# Patient Record
Sex: Female | Born: 1950 | Race: White | Hispanic: No | Smoking: Former smoker
Health system: Southern US, Community
[De-identification: ages and names within clinical notes are randomized; demographics above are authoritative.]

## PROBLEM LIST (undated history)

## (undated) DIAGNOSIS — I10 Essential (primary) hypertension: Secondary | ICD-10-CM

## (undated) DIAGNOSIS — E785 Hyperlipidemia, unspecified: Secondary | ICD-10-CM

## (undated) DIAGNOSIS — N879 Dysplasia of cervix uteri, unspecified: Secondary | ICD-10-CM

## (undated) DIAGNOSIS — M1990 Unspecified osteoarthritis, unspecified site: Secondary | ICD-10-CM

## (undated) DIAGNOSIS — M4316 Spondylolisthesis, lumbar region: Secondary | ICD-10-CM

## (undated) DIAGNOSIS — R351 Nocturia: Secondary | ICD-10-CM

## (undated) DIAGNOSIS — Z974 Presence of external hearing-aid: Secondary | ICD-10-CM

## (undated) DIAGNOSIS — R350 Frequency of micturition: Secondary | ICD-10-CM

## (undated) DIAGNOSIS — H269 Unspecified cataract: Secondary | ICD-10-CM

## (undated) HISTORY — PX: FOOT GANGLION EXCISION: SHX1660

## (undated) HISTORY — PX: OTHER SURGICAL HISTORY: SHX169

## (undated) HISTORY — PX: ABDOMINAL HYSTERECTOMY: SHX81

## (undated) HISTORY — PX: EYE SURGERY: SHX253

## (undated) HISTORY — PX: COLONOSCOPY: SHX174

---

## 1981-04-23 HISTORY — PX: OTHER SURGICAL HISTORY: SHX169

## 1983-04-24 HISTORY — PX: OTHER SURGICAL HISTORY: SHX169

## 1998-04-23 HISTORY — PX: HYSTERECTOMY ABDOMINAL WITH SALPINGECTOMY: SHX6725

## 2003-04-24 HISTORY — PX: OTHER SURGICAL HISTORY: SHX169

## 2005-02-06 ENCOUNTER — Ambulatory Visit: Payer: Self-pay | Admitting: Unknown Physician Specialty

## 2006-02-12 ENCOUNTER — Ambulatory Visit: Payer: Self-pay | Admitting: Unknown Physician Specialty

## 2007-03-04 ENCOUNTER — Ambulatory Visit: Payer: Self-pay | Admitting: Unknown Physician Specialty

## 2008-03-04 ENCOUNTER — Ambulatory Visit: Payer: Self-pay | Admitting: Unknown Physician Specialty

## 2008-03-10 ENCOUNTER — Ambulatory Visit: Payer: Self-pay | Admitting: Unknown Physician Specialty

## 2008-07-14 ENCOUNTER — Ambulatory Visit: Payer: Self-pay | Admitting: Internal Medicine

## 2008-08-09 ENCOUNTER — Encounter: Payer: Self-pay | Admitting: Neurosurgery

## 2008-08-21 ENCOUNTER — Encounter: Payer: Self-pay | Admitting: Neurosurgery

## 2009-03-08 ENCOUNTER — Ambulatory Visit: Payer: Self-pay | Admitting: Unknown Physician Specialty

## 2010-02-25 ENCOUNTER — Emergency Department: Payer: Self-pay | Admitting: Unknown Physician Specialty

## 2010-03-09 ENCOUNTER — Ambulatory Visit: Payer: Self-pay | Admitting: Unknown Physician Specialty

## 2010-04-23 HISTORY — PX: CATARACT EXTRACTION: SUR2

## 2010-12-04 ENCOUNTER — Ambulatory Visit: Payer: Self-pay | Admitting: Ophthalmology

## 2011-11-20 ENCOUNTER — Ambulatory Visit: Payer: Self-pay | Admitting: Obstetrics and Gynecology

## 2011-12-04 ENCOUNTER — Ambulatory Visit: Payer: Self-pay | Admitting: Obstetrics and Gynecology

## 2013-07-03 DIAGNOSIS — E785 Hyperlipidemia, unspecified: Secondary | ICD-10-CM | POA: Insufficient documentation

## 2013-07-03 DIAGNOSIS — Z79890 Hormone replacement therapy (postmenopausal): Secondary | ICD-10-CM | POA: Insufficient documentation

## 2013-10-15 ENCOUNTER — Ambulatory Visit: Payer: Self-pay | Admitting: Obstetrics and Gynecology

## 2013-10-19 ENCOUNTER — Ambulatory Visit: Payer: Self-pay | Admitting: Obstetrics and Gynecology

## 2014-04-21 ENCOUNTER — Ambulatory Visit: Payer: Self-pay | Admitting: Obstetrics and Gynecology

## 2014-09-28 ENCOUNTER — Other Ambulatory Visit: Payer: Self-pay | Admitting: Obstetrics and Gynecology

## 2014-09-28 DIAGNOSIS — N63 Unspecified lump in unspecified breast: Secondary | ICD-10-CM

## 2014-10-27 ENCOUNTER — Ambulatory Visit
Admission: RE | Admit: 2014-10-27 | Discharge: 2014-10-27 | Disposition: A | Discharge status: Home / Self Care | Payer: BC Managed Care – PPO | Source: Ambulatory Visit | Attending: Obstetrics and Gynecology | Admitting: Obstetrics and Gynecology

## 2014-10-27 ENCOUNTER — Ambulatory Visit: Payer: BC Managed Care – PPO

## 2014-10-27 DIAGNOSIS — N63 Unspecified lump in unspecified breast: Secondary | ICD-10-CM

## 2014-10-27 DIAGNOSIS — N6489 Other specified disorders of breast: Secondary | ICD-10-CM | POA: Insufficient documentation

## 2014-10-27 DIAGNOSIS — R921 Mammographic calcification found on diagnostic imaging of breast: Secondary | ICD-10-CM | POA: Diagnosis not present

## 2015-03-01 DIAGNOSIS — G8929 Other chronic pain: Secondary | ICD-10-CM | POA: Insufficient documentation

## 2015-03-01 DIAGNOSIS — M545 Low back pain, unspecified: Secondary | ICD-10-CM | POA: Insufficient documentation

## 2015-03-01 DIAGNOSIS — M48061 Spinal stenosis, lumbar region without neurogenic claudication: Secondary | ICD-10-CM | POA: Insufficient documentation

## 2015-03-04 ENCOUNTER — Other Ambulatory Visit: Payer: Self-pay | Admitting: Neurosurgery

## 2015-03-04 DIAGNOSIS — M4316 Spondylolisthesis, lumbar region: Secondary | ICD-10-CM

## 2015-03-09 ENCOUNTER — Ambulatory Visit: Payer: BC Managed Care – PPO

## 2015-03-24 ENCOUNTER — Ambulatory Visit
Admission: RE | Admit: 2015-03-24 | Discharge: 2015-03-24 | Disposition: A | Discharge status: Home / Self Care | Payer: BC Managed Care – PPO | Source: Ambulatory Visit | Attending: Neurosurgery | Admitting: Neurosurgery

## 2015-03-24 DIAGNOSIS — M4806 Spinal stenosis, lumbar region: Secondary | ICD-10-CM | POA: Insufficient documentation

## 2015-03-24 DIAGNOSIS — M4316 Spondylolisthesis, lumbar region: Secondary | ICD-10-CM | POA: Diagnosis present

## 2015-04-24 HISTORY — PX: LUMBAR FUSION: SHX111

## 2015-04-28 ENCOUNTER — Other Ambulatory Visit: Payer: Self-pay | Admitting: Obstetrics and Gynecology

## 2015-04-28 DIAGNOSIS — N6489 Other specified disorders of breast: Secondary | ICD-10-CM

## 2015-05-11 ENCOUNTER — Other Ambulatory Visit: Payer: BC Managed Care – PPO

## 2015-05-11 ENCOUNTER — Ambulatory Visit: Payer: BC Managed Care – PPO

## 2015-07-19 ENCOUNTER — Other Ambulatory Visit: Payer: Self-pay | Admitting: Neurosurgery

## 2015-10-03 ENCOUNTER — Other Ambulatory Visit (HOSPITAL_COMMUNITY): Payer: BC Managed Care – PPO

## 2015-10-06 ENCOUNTER — Ambulatory Visit: Payer: BC Managed Care – PPO | Attending: Neurosurgery | Admitting: Physical Therapy

## 2015-10-06 ENCOUNTER — Encounter: Payer: Self-pay | Admitting: Physical Therapy

## 2015-10-06 DIAGNOSIS — R262 Difficulty in walking, not elsewhere classified: Secondary | ICD-10-CM

## 2015-10-06 DIAGNOSIS — M5442 Lumbago with sciatica, left side: Secondary | ICD-10-CM | POA: Diagnosis present

## 2015-10-06 DIAGNOSIS — M6281 Muscle weakness (generalized): Secondary | ICD-10-CM | POA: Insufficient documentation

## 2015-10-06 DIAGNOSIS — M5441 Lumbago with sciatica, right side: Secondary | ICD-10-CM | POA: Insufficient documentation

## 2015-10-06 NOTE — Therapy (Signed)
[place] [facility] [address], [place], [address] Phone: [phone]   Fax:  [phone]  Physical Therapy Evaluation  Patient Details  Name: Angela Walters MRN: [id number] Date of Birth: 1950/12/15 Referring Provider: Peggye Ley, MD  Encounter Date: 10/06/2015      PT End of Session - 10/06/15 2015    Visit Number 1   Number of Visits 12   Date for PT Re-Evaluation 11/17/15   PT Start Time 1615   PT Stop Time 1715   PT Time Calculation (min) 60 min   Activity Tolerance Patient tolerated treatment well;No increased pain   Behavior During Therapy [facility] for tasks assessed/performed      History reviewed. No pertinent past medical history.  Past Surgical History  Procedure Laterality Date  . Excision / biopsy breast / nipple / duct Left 1985    duct removal    There were no vitals filed for this visit.       Subjective Assessment - 10/06/15 1639    Subjective Patient reports long history of back and LE pain that has gotten progressively worse over the past 20 year. She had a recent exaccerbation of low back pain on 08/14/15 from lifting a window at work when she felt increased pain radiaiting down bilateral LE the following day. Patient reports increased symptoms with prolonged lying supine, increased pain/fatigue with prolonged walking (~.7 miles) (note; patient reports prior to epidural she could only walk 1 block before pain stopped her and she was able to walk 2 miles without a problem in the past which demonstrates a decrease in function), bending forward in sitting. States increased discomfort and numbness in the anterior aspect of upper thighs .Alleviation of symptoms with taking medication, sitting and sidelying into the fetal position. Reports she is worse in the morning when not moving. States experiencing increased bladder issues since onset of pain; with increased difficulty voiding but denies  incontinence. Patient reports leg cramps wake her at night. States the worst pain she has experienced in the past week was a 10/10 and the best a 0/10 pain. Patient states previous therapy for low back pain had minimal lasting results on decreasing pain and patient reports she is now coming to therapy to satisfy her insurance company's request prior to anticipated surgery.    Pertinent History Expereicnes back pain for 20 years; Epidural on 09/06/15 with reported alleviation of severe pain in back. She was anticipating surgery 10/10/2015 until she was informed she needed to attend physical therapy prior to insurance approval .    Limitations Lifting;Walking;Sitting   How long can you walk comfortably? ~.48miles before increased fatigue and pain   Diagnostic tests Impressions from MRI report: 1. Progressive multifactorial spinal stenosis at L3-4, now severe. There is advanced facet disease with a resulting grade 1 anterolisthesis and dissue disc bulging. 2. Mild multifactorial spinal stenosis at L4-5 with asymmetric left-sdied facet hypertrophy 3. Mild multifactorial spinal stenosis at L2-3. 4. Similar overall alignment. No acute osseous findings.    Patient Stated Goals To decrease pain and improve muscular endurance   Currently in Pain? No/denies   Pain Location Back   Pain Orientation Right;Left   Pain Descriptors / Indicators Aching;Numbness;Spasm   Pain Type Acute pain;Chronic pain   Pain Radiating Towards Anterior aspect of thigh   Pain Onset More than a month ago   Pain Frequency Intermittent   Aggravating Factors  Bending forward, prolonged positioning  [facility] - 10/06/15 1632    Assessment   Medical Diagnosis M54.16 Radiculopathy, lumbar region    Referring Provider Peggye LeyJeffery Jenkins, MD   Onset Date/Surgical Date 08/14/15   Hand Dominance Right   Next MD Visit unknown   Prior Therapy None   Precautions   Precautions None   Restrictions   Weight Bearing  Restrictions No   Balance Screen   Has the patient fallen in the past 6 months No   Home Environment   Living Environment Private residence   Living Arrangements Spouse/significant other   Available Help at Discharge Family   Type of Home House   Home Access Stairs to enter   Entrance Stairs-Number of Steps 7   Entrance Stairs-Rails Can reach both   Home Layout Two level   Alternate Level Stairs-Number of Steps 13   Alternate Level Stairs-Rails Can reach both   Prior Function   Level of Independence Independent   Vocation Full time employment   Vocation Requirements Walking, sitting, reaching up    Leisure reading, walking, playing with grandson      Objective: Observation: Constantly changes positions in sitting for comfort Palpation: Increased guarding and spasms along glute max, hamstring, lumbar and thoracic paraspinals bilaterally.   Measurement:  Lumbar AROM/MMT: Flexion: WNL, Extension: WNL-- slight increase in pain at end range, Left lateral flexion: WNL, Right lateral flexion: WNL, Right rotation: WNL, Left rotation: WNL Nerve Exam: Sensation: L1:WNL, L2:WNL, L3:WNL, L4:WNL, L5:WNL, S1:WNL, S2:WNL; MMT: L1/L2:5/5 bilaterally, L3:5/5 bilaterally, L4:5/5 bilaterally, L5:5/5 bilaterally, S1:5/5 bilaterally, S2:5/5 bilaterally; Reflexes: L4:2+ bilaterally, S1:2+ bilaterally Hip AROM/MMT: Flexion:WNL;5/5, Extension:WNL;5/5 Special Testing: Cross straight leg raise, FADIR, FABER, Active SLR, sacral compression: negative  Hamstring length: >90 deg equal on both sides TrA activation in hooklying: good Multifidus activation in prone: good (increased hamstring activation with hip ext)  Outcome Measures:  MODI: 14% MODI before epidural on 09/06/15: 58%  Treatment: Therapeutic Exercise: Patient performed exercises with guidance, verbal and tactile cues and demonstration of therapist: Seated clamshells -- x 15 Seated glute squeezes/ ball squeezes -- x 15 Prone hip extension  with ball under hip -- x 15  Manual Therapy:  Soft tissue mobilization utilizing superficial techniques over the gluteals and thoracic extensors to decrease pain and spasms.   Patient response to treatment: No increase in symptoms during or after treatment session today. Good demonstration in seated clamshells requiring minimal cueing on proper hip position to decrease pain and spasms.         PT Education - 10/06/15 2014    Education provided Yes   Education Details HEP: clamshells in sitting, ball squeeze/glute squeeze in sitting. Educated on proper positioning when rising from lying   Person(s) Educated Patient   Methods Explanation;Demonstration   Comprehension Verbalized understanding;Returned demonstration             PT Long Term Goals - 10/06/15 2021    PT LONG TERM GOAL #1   Title Patient will demonstrate a MODI of <6% 11/17/15 to demonstrate significant improvement in lumbar function and ability to bend without increase in pain.   Baseline MODI: 14%   Status New   PT LONG TERM GOAL #2   Title Patient will be able to walk >591mile without onset of fatigue/pain by 11/17/15 to demonstrate significant improvement in low back function and increased functional capacity   Baseline Increased fatigue and pain after .7miles of ambulation   Status New   PT LONG TERM GOAL #3   Title Patient  will be independent with exercise performance and progression focused on improving lumbar stabilization by 11/17/15 to continue benefit from therapy after discharge.    Baseline Dependent with exercise performance and progression.   Status New               Plan - 10/06/15 2017    Clinical Impression Statement Patient is a 65 yo female experiencing increased low back pain with radiating symptoms down bilateral LEs.Patient reports long history of back and LE pain that has gotten progressively worse over the past 20 year. She had a recent exaccerbation of low back pain on 08/14/15 from  lifting a window at work when she felt increased pain radiaiting down bilateral LE the following day. Patient reports increased symptoms with prolonged lying supine, increased pain/fatigue with prolonged walking (~.7 miles) (note; patient reports prior to epidural she could only walk 1 block before pain stopped her and she was able to walk 2 miles without a problem in the past which demonstrates a decrease in function), bending forward in sitting. States increased discomfort and numbness in the anterior aspect of upper thighs .Alleviation of symptoms with taking medication, sitting and sidelying into the fetal position. Reports she is worse in the morning when not moving. States experiencing increased bladder issues since onset of pain; with increased difficulty voiding but denies incontinence. Patient reports leg cramps wake her at night. States the worst pain she has experienced in the past week was a 10/10 and the best a 0/10 pain. Patient states previous therapy for low back pain had minimal lasting results on decreasing pain and patient reports she is now coming to therapy to satisfy her insurance company's request prior to anticipated surgery.  Patient demonstrates a current MODI score of 14% dysfunction, however pain is masked by recent epidural and patient reports increased MODI score of 58% before receiving the epidural. Patient also demonstrates decreased motor control with decreased activation of multifidus/TrA activation and decreased muscular endurance with functional activities such as ambulation. Patient will benefit from trial of physical therapy to prepare for anticipated surgery.     Rehab Potential Fair   Clinical Impairments Affecting Rehab Potential (+) Family support (-) age, MRI findings signigicant for severe spinal stenosis; chronic condition   PT Frequency 2x / week   PT Duration 6 weeks   PT Treatment/Interventions Therapeutic exercise;Moist Heat;Manual techniques;Therapeutic  activities;Iontophoresis 4mg /ml Dexamethasone;Electrical Stimulation;Balance training;Ultrasound;Passive range of motion;Patient/family education   PT Next Visit Plan Advance lumbar stabilization exercises   PT Home Exercise Plan Ball squeeze/glute squeeze; seated clamshells in sitting      Patient will benefit from skilled therapeutic intervention in order to improve the following deficits and impairments:  Difficulty walking, Increased fascial restricitons, Decreased endurance, Increased muscle spasms, Decreased activity tolerance, Hypermobility, Pain, Hypomobility, Decreased strength, Increased edema, Impaired sensation  Visit Diagnosis: Bilateral low back pain with sciatica, sciatica laterality unspecified - Plan: PT plan of care cert/re-cert  Difficulty in walking, not elsewhere classified - Plan: PT plan of care cert/re-cert  Muscle weakness (generalized) - Plan: PT plan of care cert/re-cert     Problem List There are no active problems to display for this patient.   Myrene Galas, SPT 10/07/2015, 1:31 PM  [place] [facility] PH[facility] SPORTS MEDICINE [address] [place], [address] Phone: [phone]   Fax:  [phone]  Name: Angela Walters MRN: [id number] Date of Birth: [birth year]

## 2015-10-10 ENCOUNTER — Inpatient Hospital Stay (HOSPITAL_COMMUNITY): Admission: RE | Admit: 2015-10-10 | Payer: BC Managed Care – PPO | Source: Ambulatory Visit | Admitting: Neurosurgery

## 2015-10-10 ENCOUNTER — Encounter (HOSPITAL_COMMUNITY): Admission: RE | Payer: Self-pay | Source: Ambulatory Visit

## 2015-10-10 SURGERY — POSTERIOR LUMBAR FUSION 2 LEVEL
Anesthesia: General

## 2015-10-11 ENCOUNTER — Encounter: Payer: Self-pay | Admitting: Physical Therapy

## 2015-10-11 ENCOUNTER — Ambulatory Visit: Payer: BC Managed Care – PPO | Admitting: Physical Therapy

## 2015-10-11 DIAGNOSIS — M5441 Lumbago with sciatica, right side: Secondary | ICD-10-CM | POA: Diagnosis not present

## 2015-10-11 DIAGNOSIS — M6281 Muscle weakness (generalized): Secondary | ICD-10-CM

## 2015-10-11 DIAGNOSIS — R262 Difficulty in walking, not elsewhere classified: Secondary | ICD-10-CM

## 2015-10-11 DIAGNOSIS — M5442 Lumbago with sciatica, left side: Principal | ICD-10-CM

## 2015-10-11 NOTE — Therapy (Signed)
[place] [facility] [address] [place], [place], [address] Phone: [phone]   Fax:  [phone]  Physical Therapy Treatment  Patient Details  Name: Angela Walters MRN: [id number] Date of Birth: 03-16-1951 Referring Provider: Peggye LeyJeffery Jenkins, MD  Encounter Date: 10/11/2015      PT End of Session - 10/11/15 1024    Visit Number 2   Number of Visits 12   Date for PT Re-Evaluation 11/17/15   PT Start Time 0800   PT Stop Time 0844   PT Time Calculation (min) 44 min   Activity Tolerance Patient tolerated treatment well;No increased pain   Behavior During Therapy [facility] for tasks assessed/performed      History reviewed. No pertinent past medical history.  Past Surgical History  Procedure Laterality Date  . Excision / biopsy breast / nipple / duct Left 1985    duct removal    There were no vitals filed for this visit.      Subjective Assessment - 10/11/15 0812    Subjective Patient states she's been performing HEP and went to water aerobics class without any increase in pain. Reports she felt increased fatigue after performing water aerobics. Reports she has increased aggravated pain when performing lumbar extension.    Pertinent History Expereicnes back pain for 20 years; Epidural on 09/06/15 with reported alleviation of severe pain in back. She was anticipating surgery 10/10/2015 until she was informed she needed to attend physical therapy prior to insurance approval .    Limitations Lifting;Walking;Sitting   How long can you walk comfortably? ~.777miles before increased fatigue and pain   Diagnostic tests Impressions from MRI report: 1. Progressive multifactorial spinal stenosis at L3-4, now severe. There is advanced facet disease with a resulting grade 1 anterolisthesis and dissue disc bulging. 2. Mild multifactorial spinal stenosis at L4-5 with asymmetric left-sdied facet hypertrophy 3. Mild multifactorial spinal stenosis  at L2-3. 4. Similar overall alignment. No acute osseous findings.    Patient Stated Goals To decrease pain and improve muscular endurance   Currently in Pain? No/denies   Pain Onset More than a month ago      Objective: Observation: Constantly changes positions in sitting for comfort Palpation: Increased guarding and spasms along glute max, hamstring, lumbar and thoracic paraspinals bilaterally.    Treatment: Therapeutic Exercise: Patient performed exercises with guidance, verbal and tactile cues and demonstration of therapist: Seated clamshells -- x 15 Seated glute squeezes/ ball squeezes -- x 15 Seated ball roll outs in sitting -- 2 min LTRs in hookyling with legs underneath physioball -- 2min  Hooklying ball squeeze glute squeeze -- x20 Single leg fall outs in hookyling with TrA contraction -- x20 Sidelying clamshells -- x25 Sidelying reverse clamshells --x30 Sidelying hip abduction -- x10 :significant increase in fatigue   Patient response to treatment: No increase in symptoms during or after treatment session today. Good demonstration of sidelying hip abductionrequiring minimal cueing on hip position to perform with proper muscular activation.         PT Education - 10/11/15 1022    Education provided Yes   Education Details HEP: sidelying clamshells, reverse clam shells, sidelying hip abduction   Person(s) Educated Patient   Methods Demonstration;Explanation   Comprehension Verbalized understanding;Returned demonstration             PT Long Term Goals - 10/06/15 2021    PT LONG TERM GOAL #1   Title Patient will demonstrate a MODI of <6% 11/17/15  to demonstrate significant improvement in lumbar function and ability to bend without increase in pain.   Baseline MODI: 14%   Status New   PT LONG TERM GOAL #2   Title Patient will be able to walk >83mile without onset of fatigue/pain by 11/17/15 to demonstrate significant improvement in low back function and  increased functional capacity   Baseline Increased fatigue and pain after . of ambulation   Status New   PT LONG TERM GOAL #3   Title Patient will be independent with exercise performance and progression focused on improving lumbar stabilization by 11/17/15 to continue benefit from therapy after discharge.    Baseline Dependent with exercise performance and progression.   Status New               Plan - 10/11/15 1027    Clinical Impression Statement Patient demonstrates no significant change in pain or symptoms during or after therapy session today indicating no functional carryover compared to previous treatment session Pt demonstrates considerable weakness along lateral hip stabilization musculature most noted along glute med and hip abductors and will benefit from further trial of skilled therapy to improve weakness and decreased endurance.   Rehab Potential Fair   Clinical Impairments Affecting Rehab Potential (+) Family support (-) age, MRI findings signigicant for severe spinal stenosis; chronic condition   PT Frequency 2x / week   PT Duration 6 weeks   PT Treatment/Interventions Therapeutic exercise;Moist Heat;Manual techniques;Therapeutic activities;Iontophoresis /ml Dexamethasone;Electrical Stimulation;Balance training;Ultrasound;Passive range of motion;Patient/family education   PT Next Visit Plan Advance lumbar stabilization exercises   PT Home Exercise Plan Ball squeeze/glute squeeze; seated clamshells in sitting      Patient will benefit from skilled therapeutic intervention in order to improve the following deficits and impairments:  Difficulty walking, Increased fascial restricitons, Decreased endurance, Increased muscle spasms, Decreased activity tolerance, Hypermobility, Pain, Hypomobility, Decreased strength, Increased edema, Impaired sensation  Visit Diagnosis: Bilateral low back pain with sciatica, sciatica laterality unspecified  Difficulty in walking,  not elsewhere classified  Muscle weakness (generalized)     Problem List There are no active problems to display for this patient.   Myrene Galas, SPT 10/12/2015, 8:50 AM  [place] [facility] PHYSICAL AND SPORTS MEDICINE [address] [place], [address] Phone: [phone]   Fax:  [phone]  Name: Angela Walters MRN: [id number] Date of Birth: [birth year]

## 2015-10-12 ENCOUNTER — Encounter: Payer: Self-pay | Admitting: Physical Therapy

## 2015-10-12 ENCOUNTER — Encounter: Payer: BC Managed Care – PPO | Admitting: Physical Therapy

## 2015-10-12 ENCOUNTER — Ambulatory Visit: Payer: BC Managed Care – PPO | Admitting: Physical Therapy

## 2015-10-12 DIAGNOSIS — M5442 Lumbago with sciatica, left side: Principal | ICD-10-CM

## 2015-10-12 DIAGNOSIS — M6281 Muscle weakness (generalized): Secondary | ICD-10-CM

## 2015-10-12 DIAGNOSIS — M5441 Lumbago with sciatica, right side: Secondary | ICD-10-CM

## 2015-10-12 DIAGNOSIS — R262 Difficulty in walking, not elsewhere classified: Secondary | ICD-10-CM

## 2015-10-12 NOTE — Therapy (Signed)
[facility] [facility] [address] [place], [place], [address] Phone: [phone]   Fax:  [phone]  Physical Therapy Treatment  Patient Details  Name: Angela BradfordMary T Walters MRN: [id number] Date of Birth: Aug 22, 1950 Referring Provider: Peggye LeyJeffery Jenkins, MD  Encounter Date: 10/12/2015      PT End of Session - 10/12/15 0957    Visit Number 3   Number of Visits 12   Date for PT Re-Evaluation 11/17/15   PT Start Time 0902   PT Stop Time 0952   PT Time Calculation (min) 50 min   Activity Tolerance Patient tolerated treatment well;No increased pain   Behavior During Therapy [facility] for tasks assessed/performed      History reviewed. No pertinent past medical history.  Past Surgical History  Procedure Laterality Date  . Excision / biopsy breast / nipple / duct Left 1985    duct removal    There were no vitals filed for this visit.      Subjective Assessment - 10/12/15 0903    Subjective Patient states no improvement in function or symptoms and reports increased pain and numbness along the lateral aspect of her right lower thigh when waking in the morning. States she's been attending water aerobics class to improve functional endurance.    Pertinent History Expereicnes back pain for 20 years; Epidural on 09/06/15 with reported alleviation of severe pain in back. She was anticipating surgery 10/10/2015 until she was informed she needed to attend physical therapy prior to insurance approval .    Limitations Lifting;Walking;Sitting   How long can you walk comfortably? ~.917miles before increased fatigue and pain   Diagnostic tests Impressions from MRI report: 1. Progressive multifactorial spinal stenosis at L3-4, now severe. There is advanced facet disease with a resulting grade 1 anterolisthesis and dissue disc bulging. 2. Mild multifactorial spinal stenosis at L4-5 with asymmetric left-sdied facet hypertrophy 3. Mild multifactorial spinal  stenosis at L2-3. 4. Similar overall alignment. No acute osseous findings.    Patient Stated Goals To decrease pain and improve muscular endurance   Currently in Pain? No/denies   Pain Onset More than a month ago      Objective: Observation: Constantly changes positions in sitting for comfort Palpation: Increased guarding and spasms along glute max, glute medius, hamstring, lumbar and thoracic paraspinals bilaterally.   Functional Outcome measures: 6 min walk test: Resting HR: 92bpm; HR at completion of test: 104bpm, 13100ft ambulated   Treatment: Therapeutic Exercise: Patient performed exercises with guidance, verbal and tactile cues and demonstration of therapist:  Standing at OMEGA scapular retraction -- x15 10# Standing at OMEGA straight arm push downs -- x 15 10# Sidelying clamshells -- x20 Sit to stands with ball squeeze -- x15 bilateral Sidelying reverse clamshells --x25 bilateral Sidelying hip abduction -- x15 bilateral TrA contraction with contralateral UE overhead flexion and LE marches -- x15   Patient response to treatment: No increase in symptoms during or after treatment session today. Good demonstration of standing straight arm push downs requiring minimal cueing on hip position and core activation to perform with proper technique.        PT Education - 10/12/15 0958    Education provided Yes   Education Details HEP: scapular retraction and straight arm push downs in standing   Person(s) Educated Patient   Methods Explanation;Demonstration   Comprehension Verbalized understanding;Returned demonstration             PT Long Term Goals - 10/06/15 2021  PT LONG TERM GOAL #1   Title Patient will demonstrate a MODI of <6% 11/17/15 to demonstrate significant improvement in lumbar function and ability to bend without increase in pain.   Baseline MODI: 14%   Status New   PT LONG TERM GOAL #2   Title Patient will be able to walk >49mile without onset of  fatigue/pain by 11/17/15 to demonstrate significant improvement in low back function and increased functional capacity   Baseline Increased fatigue and pain after . of ambulation   Status New   PT LONG TERM GOAL #3   Title Patient will be independent with exercise performance and progression focused on improving lumbar stabilization by 11/17/15 to continue benefit from therapy after discharge.    Baseline Dependent with exercise performance and progression.   Status New               Plan - 10/12/15 0959    Clinical Impression Statement Patient demonstrates no significant improvement in pain or symptoms during or after treatment session and continues to demonstrate increased fatigue with exercise performance indicating decreased muscular endurance. Patient demonstrates ability to ambulate 1377ft with 6 min walk test without maladaptive change in HR, however these symptoms are likely masked by epidural injection and patient will benefit from further skilled therapy to improve hip strengthening/endurance to prepare for lumbar surgery.   Rehab Potential Fair   Clinical Impairments Affecting Rehab Potential (+) Family support (-) age, MRI findings signigicant for severe spinal stenosis; chronic condition   PT Frequency 2x / week   PT Duration 6 weeks   PT Treatment/Interventions Therapeutic exercise;Moist Heat;Manual techniques;Therapeutic activities;Iontophoresis /ml Dexamethasone;Electrical Stimulation;Balance training;Ultrasound;Passive range of motion;Patient/family education   PT Next Visit Plan Advance lumbar stabilization exercises   PT Home Exercise Plan Ball squeeze/glute squeeze; seated clamshells in sitting      Patient will benefit from skilled therapeutic intervention in order to improve the following deficits and impairments:  Difficulty walking, Increased fascial restricitons, Decreased endurance, Increased muscle spasms, Decreased activity tolerance, Hypermobility,  Pain, Hypomobility, Decreased strength, Increased edema, Impaired sensation  Visit Diagnosis: Bilateral low back pain with sciatica, sciatica laterality unspecified  Difficulty in walking, not elsewhere classified  Muscle weakness (generalized)     Problem List There are no active problems to display for this patient.   Myrene Galas, SPT 10/12/2015, 10:19 AM  [place] [facility] PHYSICAL AND SPORTS MEDICINE [address] [place], [address] Phone: [phone]   Fax:  [phone]  Name: Angela Walters MRN: [id number] Date of Birth: February 11, 1951

## 2015-10-14 ENCOUNTER — Encounter: Payer: BC Managed Care – PPO | Admitting: Physical Therapy

## 2015-10-18 ENCOUNTER — Encounter: Payer: Self-pay | Admitting: Physical Therapy

## 2015-10-18 ENCOUNTER — Ambulatory Visit: Payer: BC Managed Care – PPO | Admitting: Physical Therapy

## 2015-10-18 DIAGNOSIS — R262 Difficulty in walking, not elsewhere classified: Secondary | ICD-10-CM

## 2015-10-18 DIAGNOSIS — M5442 Lumbago with sciatica, left side: Principal | ICD-10-CM

## 2015-10-18 DIAGNOSIS — M5441 Lumbago with sciatica, right side: Secondary | ICD-10-CM

## 2015-10-18 DIAGNOSIS — M6281 Muscle weakness (generalized): Secondary | ICD-10-CM

## 2015-10-18 NOTE — Therapy (Signed)
[facility] [address] [place], [place], [address] Phone: [phone]   Fax:  [phone]  Physical Therapy Treatment  Patient Details  Name: Angela Walters MRN: [id number] Date of Birth: April 25, 1950 Referring Provider: Peggye LeyJeffery Jenkins, MD  Encounter Date: 10/18/2015      PT End of Session - 10/18/15 1121    Visit Number 4   Number of Visits 12   Date for PT Re-Evaluation 11/17/15   PT Start Time 1013   PT Stop Time 1100   PT Time Calculation (min) 47 min   Activity Tolerance Patient tolerated treatment well;No increased pain   Behavior [facility] Therapy [facility] for [facility] assessed/performed      History reviewed. No pertinent past medical history.  Past Surgical History  Procedure Laterality Date  . Excision / biopsy breast / nipple / duct Left 1985    duct removal    There were no vitals filed for this visit.      Subjective Assessment - 10/18/15 1016    Subjective Patient states she contacted her referring physician about increase in symptoms and aggravation of radiating pain and sensation down her bilateral gluteals into the posterior aspect of the leg. Patinet states she continues to experience bladder issues stating she requires increased effort to completely void her bladder. Patient reports she is only able to walk .7 miles currently. Pt was able to walk 2 miles without difficulty prior to recent exacerbation on 08/14/15.     Pertinent History Expereicnes back pain for 20 years; Epidural on 09/06/15 with reported alleviation of severe pain in back. She was anticipating surgery 10/10/2015 until she was informed she needed to attend physical therapy prior to insurance approval .    Limitations Lifting;Walking;Sitting   How long can you walk comfortably? ~.617miles before increased fatigue and pain   Diagnostic tests Impressions from MRI report: 1. Progressive multifactorial spinal stenosis at L3-4, now severe. There  is advanced facet disease with a resulting grade 1 anterolisthesis and dissue disc bulging. 2. Mild multifactorial spinal stenosis at L4-5 with asymmetric left-sdied facet hypertrophy 3. Mild multifactorial spinal stenosis at L2-3. 4. Similar overall alignment. No acute osseous findings.    Patient Stated Goals To decrease pain and improve muscular endurance   Currently in Pain? No/denies   Pain Location Back   Pain Orientation Left;Right   Pain Descriptors / Indicators Aching;Spasm;Numbness   Pain Type Acute pain;Chronic pain   Pain Onset More than a month ago      Objective: Observation: Constantly changes positions in sitting for comfort  Treatment: Therapeutic Exercise: Patient performed exercises with guidance, verbal and tactile cues and demonstration of therapist:  Sidelying clamshells -- x20 Sidelying reverse clamshells --x25 bilateral Sidelying hip abduction -- x15 bilateral Sit to stands with ball squeeze -- x20 bilateral TrA contraction with leg extension -- x15 Standing at OMEGA scapular retraction -- x15 10# Standing at OMEGA straight arm push downs -- x 15 10#; low rows -- x20 10# [employer] SystemsWedding Marches 3 laps down and back with 5# in both hands -- ~12100ft Resisted walk outs with grey band -- x5 (laterally, forward, backward)  Patient response to treatment: No increase in symptoms during or after treatment session today. Good demonstration of sidelying hip abduction requiring frequent tactile cueing on hip/lumbar position to perform with appropriate technique.          PT Education - 10/18/15 1122    Education provided Yes   Education Details HEP:  Wedding marches, resisted walk outs in standing   Person(s) Educated Patient   Methods Explanation;Demonstration   Comprehension Verbalized understanding;Returned demonstration             PT Long Term Goals - 10/06/15 2021    PT LONG TERM GOAL #1   Title Patient will demonstrate a MODI of <6% 11/17/15 to demonstrate  significant improvement in lumbar function and ability to bend without increase in pain.   Baseline MODI: 14%   Status New   PT LONG TERM GOAL #2   Title Patient will be able to walk >341mile without onset of fatigue/pain by 11/17/15 to demonstrate significant improvement in low back function and increased functional capacity   Baseline Increased fatigue and pain after .7miles of ambulation   Status New   PT LONG TERM GOAL #3   Title Patient will be independent with exercise performance and progression focused on improving lumbar stabilization by 11/17/15 to continue benefit from therapy after discharge.    Baseline Dependent with exercise performance and progression.   Status New               Plan - 10/18/15 1114    Clinical Impression Statement Patient demonstrates decreased proprioceptive in  hip/lumbar position requiring frequent tactile and verbal cueing to acheive a neutral hip/lumbar position. No change or improvement of symptoms since the previous visit with patient stating continued radiating symptoms down B LEs and difiiculty voiding when urinating and will benefit from further skilled therapy to improve hip muscular strengthing/endurance to prepare for lumbar surgery.    Rehab Potential Fair   Clinical Impairments Affecting Rehab Potential (+) Family support (-) age, MRI findings signigicant for severe spinal stenosis; chronic condition   PT Frequency 2x / week   PT Duration 6 weeks   PT Treatment/Interventions Therapeutic exercise;Moist Heat;Manual techniques;Therapeutic activities;Iontophoresis 4mg /ml Dexamethasone;Electrical Stimulation;Balance training;Ultrasound;Passive range of motion;Patient/family education   PT Next Visit Plan Advance lumbar stabilization exercises   PT Home Exercise Plan Ball squeeze/glute squeeze; seated clamshells in sitting      Patient will benefit from skilled therapeutic intervention in order to improve the following deficits and impairments:   Difficulty walking, Increased fascial restricitons, Decreased endurance, Increased muscle spasms, Decreased activity tolerance, Hypermobility, Pain, Hypomobility, Decreased strength, Increased edema, Impaired sensation  Visit Diagnosis: Bilateral low back pain with sciatica, sciatica laterality unspecified  Difficulty in walking, not elsewhere classified  Muscle weakness (generalized)     Problem List There are no active problems to display for this patient.   Myrene GalasWesley Jireh Elmore, SPT 10/19/2015, 8:01 AM  [facility] [place] [facility] [address] [place], [place], [address] Phone: [phone]   Fax:  [phone]  Name: Angela Walters MRN: [id number] Date of Birth: 1950/11/26

## 2015-10-21 ENCOUNTER — Encounter: Payer: Self-pay | Admitting: Physical Therapy

## 2015-10-21 ENCOUNTER — Ambulatory Visit: Payer: BC Managed Care – PPO | Admitting: Physical Therapy

## 2015-10-21 DIAGNOSIS — M5441 Lumbago with sciatica, right side: Secondary | ICD-10-CM | POA: Diagnosis not present

## 2015-10-21 DIAGNOSIS — R262 Difficulty in walking, not elsewhere classified: Secondary | ICD-10-CM

## 2015-10-21 DIAGNOSIS — M6281 Muscle weakness (generalized): Secondary | ICD-10-CM

## 2015-10-21 DIAGNOSIS — M5442 Lumbago with sciatica, left side: Principal | ICD-10-CM

## 2015-10-21 NOTE — Therapy (Signed)
[place] [facility] [address] [place], [place], [address] Phone: [phone]   Fax:  [phone]  Physical Therapy Treatment  Patient Details  Name: Angela [place] T Moyers MRN: [id number] Date of Birth: Apr 05, 1951 Referring Provider: Peggye LeyJeffery Jenkins, MD  Encounter Date: 10/21/2015      PT End of Session - 10/21/15 1255    Visit Number 5   Number of Visits 12   Date for PT Re-Evaluation 11/17/15   PT Start Time 0930   PT Stop Time 1015   PT Time Calculation (min) 45 min   Activity Tolerance Patient tolerated treatment well;No increased pain   Behavior [facility] Therapy [facility] for tasks assessed/performed      History reviewed. No pertinent past medical history.  Past Surgical History  Procedure Laterality Date  . Excision / biopsy breast / nipple / duct Left 1985    duct removal    There were no vitals filed for this visit.      Subjective Assessment - 10/21/15 0935    Subjective Patient states a worsening in symptoms today reporting increased bilateral back and leg pain (Increased on the left more than the right). Patient states she took muscle relaxers 2 hours ago with no change in symptoms. Patient also reports increased cervical  pain   Pertinent History Expereicnes back pain for 20 years; Epidural on 09/06/15 with reported alleviation of severe pain in back. She was anticipating surgery 10/10/2015 until she was informed she needed to attend physical therapy prior to insurance approval .    Limitations Lifting;Walking;Sitting   How long can you walk comfortably? ~.707miles before increased fatigue and pain   Diagnostic tests Impressions from MRI report: 1. Progressive multifactorial spinal stenosis at L3-4, now severe. There is advanced facet disease with a resulting grade 1 anterolisthesis and dissue disc bulging. 2. Mild multifactorial spinal stenosis at L4-5 with asymmetric left-sdied facet hypertrophy 3. Mild multifactorial  spinal stenosis at L2-3. 4. Similar overall alignment. No acute osseous findings.    Patient Stated Goals To decrease pain and improve muscular endurance   Currently in Pain? Yes   Pain Score 4    Pain Location Back   Pain Orientation Right;Left   Pain Descriptors / Indicators Aching   Pain Type Acute pain;Chronic pain   Pain Onset More than a month ago   Pain Frequency Intermittent     Objective: Observation: Constantly changes positions in sitting for comfort  Treatment: Therapeutic Exercise: Patient performed exercises with guidance, verbal and tactile cues and demonstration of therapist:  Sidelying clamshells -- x20 Sidelying reverse clamshells --x25 bilateral Prone hip extension -- 2 x 10 (leg straight) , x10 knee bent (Increased pain on the left when performed on the right) Sidelying hip abduction -- x15 bilateral TrA contraction with leg extension -- x15 Seated lumbar extension against grey band -- x 15 Wedding Marches forward:  3 laps down and back with 5# in both hands -- ~14200ft; backwards without weight ~3233ft Standing at OMEGA scapular retraction -- x15 10# Standing at OMEGA straight arm push downs -- x 15 10#; low rows -- x20 10#  Manual Therapy: STM to the thoracic/lumbar extensors utilizing superficial and deep in prone to decreased incereased pain and spasms.  Patient response to treatment: Minimal increase in left sided glute/back pain when performing right hip extension with the knee bent indicating increased inflammation. Good demonstration of sidelying hip abduction requiring frequent tactile cueing on hip/lumbar position to perform with appropriate technique.  Increased back pain with radiating symptoms following treatment session and patient had to sit to stop the increase in pain; this did not correlate with any specific movements.          PT Education - 10/21/15 1307    Education provided Yes   Education Details HEP: resisted lumbar extension in  sitting   Person(s) Educated Patient   Methods Demonstration;Explanation   Comprehension Returned demonstration;Verbalized understanding             PT Long Term Goals - 10/06/15 2021    PT LONG TERM GOAL #1   Title Patient will demonstrate a MODI of <6% 11/17/15 to demonstrate significant improvement in lumbar function and ability to bend without increase in pain.   Baseline MODI: 14%   Status New   PT LONG TERM GOAL #2   Title Patient will be able to walk >441mile without onset of fatigue/pain by 11/17/15 to demonstrate significant improvement in low back function and increased functional capacity   Baseline Increased fatigue and pain after .7miles of ambulation   Status New   PT LONG TERM GOAL #3   Title Patient will be independent with exercise performance and progression focused on improving lumbar stabilization by 11/17/15 to continue benefit from therapy after discharge.    Baseline Dependent with exercise performance and progression.   Status New               Plan - 10/21/15 1302    Clinical Impression Statement Patient demonstrates increased fatigue with less repetitions performed compared to previous visits indicating a decrease in muscular endurance and no funcitonal carryover between visits. Patient demonstrates higher resting pain compared to previous visits and symptoms appear to being getting worse.  Patient experiences increased bilateral radiating low back pain when standing in line to schedule an appointment 5 min after therapy and required a sitting rest break to decrease her pain.    Rehab Potential Fair   Clinical Impairments Affecting Rehab Potential (+) Family support (-) age, MRI findings signigicant for severe spinal stenosis; chronic condition   PT Frequency 2x / week   PT Duration 6 weeks   PT Treatment/Interventions Therapeutic exercise;Moist Heat;Manual techniques;Therapeutic activities;Iontophoresis 4mg /ml Dexamethasone;Electrical Stimulation;Balance  training;Ultrasound;Passive range of motion;Patient/family education   PT Next Visit Plan Advance lumbar stabilization exercises   PT Home Exercise Plan Ball squeeze/glute squeeze; seated clamshells in sitting      Patient will benefit from skilled therapeutic intervention in order to improve the following deficits and impairments:  Difficulty walking, Increased fascial restricitons, Decreased endurance, Increased muscle spasms, Decreased activity tolerance, Hypermobility, Pain, Hypomobility, Decreased strength, Increased edema, Impaired sensation  Visit Diagnosis: Bilateral low back pain with sciatica, sciatica laterality unspecified  Difficulty in walking, not elsewhere classified  Muscle weakness (generalized)     Problem List There are no active problems to display for this patient.   Myrene GalasWesley Skylin Kennerson, SPT 10/21/2015, 1:09 PM  [place] [facility] [address] [place], [place], [address] Phone: [phone]   Fax:  [phone]  Name: Angela [place] T Fessel MRN: [id number] Date of Birth: 01-22-1951

## 2015-10-26 ENCOUNTER — Encounter: Payer: Self-pay | Admitting: Physical Therapy

## 2015-10-26 ENCOUNTER — Ambulatory Visit: Payer: BC Managed Care – PPO | Attending: Neurosurgery | Admitting: Physical Therapy

## 2015-10-26 ENCOUNTER — Other Ambulatory Visit: Payer: Self-pay | Admitting: Neurosurgery

## 2015-10-26 DIAGNOSIS — M5441 Lumbago with sciatica, right side: Secondary | ICD-10-CM | POA: Diagnosis not present

## 2015-10-26 DIAGNOSIS — M6281 Muscle weakness (generalized): Secondary | ICD-10-CM | POA: Diagnosis present

## 2015-10-26 DIAGNOSIS — R262 Difficulty in walking, not elsewhere classified: Secondary | ICD-10-CM | POA: Insufficient documentation

## 2015-10-26 DIAGNOSIS — M5442 Lumbago with sciatica, left side: Secondary | ICD-10-CM | POA: Insufficient documentation

## 2015-10-26 NOTE — Therapy (Signed)
[place] PHYSICAL AND SPORTS MEDICINE [address] [place], [address] Phone: [phone]   Fax:  [phone]  Physical Therapy Treatment/ Discharge Summary  Patient Details  Name: Angela Walters MRN: [id number] Date of Birth: 25-Nov-1950 Referring Provider: Frederich Cha, MD  Encounter Date: 10/26/2015     Patient began physical therapy 10/06/15 and attended 6 sessions through 10/26/15 with goals partially met due to change in medical status:  Patient scheduled for lumbar surgery to be performed next  week. Plan: discharge from physical therapy.       PT End of Session - 10/26/15 1937    Visit Number 6   Number of Visits 12   Date for PT Re-Evaluation 11/17/15   PT Start Time 0850   PT Stop Time 0930   PT Time Calculation (min) 40 min   Activity Tolerance Patient tolerated treatment well;No increased pain   Behavior During Therapy [facility] for tasks assessed/performed      History reviewed. No pertinent past medical history.  Past Surgical History  Procedure Laterality Date  . Excision / biopsy breast / nipple / duct Left 1985    duct removal    There were no vitals filed for this visit.      Subjective Assessment - 10/26/15 0851    Subjective Patient states similar symptoms with no change in pain. States having increased pain radiating down the left side of her left leg. Reports she had to take pain medication on Saturday due to onset of symptoms. (note: patient returned to therapy clinic after leaving to inform office she is scheduled for back surgery next week and will not be returning to therapy)   Pertinent History Expereicnes back pain for 20 years; Epidural on 09/06/15 with reported alleviation of severe pain in back. She was anticipating surgery 10/10/2015 until she was informed she needed to attend physical therapy prior to insurance approval .    Limitations Lifting;Walking;Sitting   How long can you walk comfortably?  ~.92mles before increased fatigue and pain   Diagnostic tests Impressions from MRI report: 1. Progressive multifactorial spinal stenosis at L3-4, now severe. There is advanced facet disease with a resulting grade 1 anterolisthesis and dissue disc bulging. 2. Mild multifactorial spinal stenosis at L4-5 with asymmetric left-sdied facet hypertrophy 3. Mild multifactorial spinal stenosis at L2-3. 4. Similar overall alignment. No acute osseous findings.    Patient Stated Goals To decrease pain and improve muscular endurance   Currently in Pain? No/denies   Pain Onset More than a month ago      Objective: Observation: Constantly changes positions in sitting for comfort  Treatment: Therapeutic Exercise: Patient performed exercises with guidance, verbal and tactile cues and demonstration of therapist:  Low row to hips at [place]-- x20 10# TRX scapular retraction from a chair -- x10  Standing at OWalnutstraight arm push downs -- x 15 10# Standing at OAllentownscapular retraction -- x15 10# Balance stones in standing -- side stepping; staggered x 10 each direction Hip Machine -- B ABD x20 #25, B EXT: x20 #70 Ball Toss on airex passes -- 2# x20  Resisted walkouts against grey band -- x6 each direction   Patient response to treatment: No aggravation of symptoms during or after  treatment session. Good demonstration of hip ext at machine requiring moderate cueing for porper muscular activation.         PT Education - 10/26/15 1937    Education provided Yes   Education  Details HEP: Diona Foley toss on airex pad   Person(s) Educated Patient   Methods Explanation;Demonstration   Comprehension Verbalized understanding;Returned demonstration             PT Long Term Goals - 10/26/15 1929    PT LONG TERM GOAL #1   Title Patient will demonstrate a MODI of <6% 11/17/15 to demonstrate significant improvement in lumbar function and ability to bend without increase in pain.   Baseline MODI: 14% (10/26/15:  Not assessed secondary to scheduling surgery)   Status Partially Met   PT LONG TERM GOAL #2   Title Patient will be able to walk >12mle without onset of fatigue/pain by 11/17/15 to demonstrate significant improvement in low back function and increased functional capacity   Baseline Increased fatigue and pain after .788mes of ambulation (10/26/15: Not assessed secondary to scheduling surgery)   Status Partially Met   PT LONG TERM GOAL #3   Title Patient will be independent with exercise performance and progression focused on improving lumbar stabilization by 11/17/15 to continue benefit from therapy after discharge.    Baseline Dependent with exercise performance and progression. (10/26/15: Requires minimal cueing on porper exercise performance.)   Status Partially Met               Plan - 10/26/15 1934    Clinical Impression Statement Patient demonstrates no significant change in symptoms and requires minimal cueing for exercises performed indicating no functional carryover between visits. Patient to have lumbar surgery next week (contacted office after leaving from therapy session) and will therefore be discharged from phsyical therapy. Re assessment of MODI, ROM and strength not formally re assessed due to sudden change in status and being scheduled for surgery.    Rehab Potential Fair   Clinical Impairments Affecting Rehab Potential (+) Family support (-) age, MRI findings signigicant for severe spinal stenosis; chronic condition   PT Frequency 2x / week   PT Duration 6 weeks   PT Treatment/Interventions Therapeutic exercise;Moist Heat;Manual techniques;Therapeutic activities;Iontophoresis 39m63ml Dexamethasone;Electrical Stimulation;Balance training;Ultrasound;Passive range of motion;Patient/family education   PT Next Visit Plan Discharge   PT Home Exercise Plan Ball squeeze/glute squeeze; seated clamshells in sitting      Patient will benefit from skilled therapeutic intervention in  order to improve the following deficits and impairments:  Difficulty walking, Increased fascial restricitons, Decreased endurance, Increased muscle spasms, Decreased activity tolerance, Hypermobility, Pain, Hypomobility, Decreased strength, Increased edema, Impaired sensation  Visit Diagnosis: Bilateral low back pain with sciatica, sciatica laterality unspecified  Difficulty in walking, not elsewhere classified  Muscle weakness (generalized)     Problem List There are no active problems to display for this patient.   WesBlythe StanfordPT 10/26/2015, 7:38 PM  [facility] [address],[place]one: [phone]:  [phone]: MarAVACYN KLOOSTERMANN: [id number] of Birth: 11/Nov 18, 1950

## 2015-10-28 ENCOUNTER — Encounter (HOSPITAL_COMMUNITY): Payer: Self-pay | Admitting: *Deleted

## 2015-10-28 MED ORDER — MUPIROCIN 2 % EX OINT
1.0000 "application " | TOPICAL_OINTMENT | Freq: Once | CUTANEOUS | Status: AC
  Administered 2015-10-31: 1 via TOPICAL
  Filled 2015-10-28: qty 22

## 2015-10-31 ENCOUNTER — Inpatient Hospital Stay (HOSPITAL_COMMUNITY): Payer: BC Managed Care – PPO | Admitting: Anesthesiology

## 2015-10-31 ENCOUNTER — Inpatient Hospital Stay (HOSPITAL_COMMUNITY): Payer: BC Managed Care – PPO

## 2015-10-31 ENCOUNTER — Encounter (HOSPITAL_COMMUNITY)
Admission: RE | Disposition: A | Discharge status: Home / Self Care | Payer: Self-pay | Source: Ambulatory Visit | Attending: Neurosurgery

## 2015-10-31 ENCOUNTER — Inpatient Hospital Stay (HOSPITAL_COMMUNITY)
Admission: RE | Admit: 2015-10-31 | Discharge: 2015-11-02 | DRG: 460 | Disposition: A | Discharge status: Home / Self Care | Payer: BC Managed Care – PPO | Source: Ambulatory Visit | Attending: Neurosurgery | Admitting: Neurosurgery

## 2015-10-31 ENCOUNTER — Ambulatory Visit: Payer: BC Managed Care – PPO | Admitting: Physical Therapy

## 2015-10-31 ENCOUNTER — Encounter (HOSPITAL_COMMUNITY): Payer: Self-pay | Admitting: *Deleted

## 2015-10-31 DIAGNOSIS — M4316 Spondylolisthesis, lumbar region: Principal | ICD-10-CM | POA: Diagnosis present

## 2015-10-31 DIAGNOSIS — M4806 Spinal stenosis, lumbar region: Secondary | ICD-10-CM | POA: Diagnosis present

## 2015-10-31 DIAGNOSIS — M5416 Radiculopathy, lumbar region: Secondary | ICD-10-CM | POA: Diagnosis present

## 2015-10-31 DIAGNOSIS — Z419 Encounter for procedure for purposes other than remedying health state, unspecified: Secondary | ICD-10-CM

## 2015-10-31 DIAGNOSIS — Z87891 Personal history of nicotine dependence: Secondary | ICD-10-CM

## 2015-10-31 HISTORY — DX: Nocturia: R35.1

## 2015-10-31 HISTORY — DX: Unspecified osteoarthritis, unspecified site: M19.90

## 2015-10-31 HISTORY — DX: Frequency of micturition: R35.0

## 2015-10-31 LAB — BASIC METABOLIC PANEL
ANION GAP: 9 (ref 5–15)
BUN: 14 mg/dL (ref 6–20)
CALCIUM: 9.4 mg/dL (ref 8.9–10.3)
CHLORIDE: 104 mmol/L (ref 101–111)
CO2: 24 mmol/L (ref 22–32)
Creatinine, Ser: 0.7 mg/dL (ref 0.44–1.00)
GFR calc non Af Amer: >60 mL/min (ref >60)
GLUCOSE: 97 mg/dL (ref 65–99)
POTASSIUM: 3.7 mmol/L (ref 3.5–5.1)
Sodium: 137 mmol/L (ref 135–145)

## 2015-10-31 LAB — CBC
HEMATOCRIT: 40.2 % (ref 36.0–46.0)
HEMOGLOBIN: 13.7 g/dL (ref 12.0–15.0)
MCH: 31.5 pg (ref 26.0–34.0)
MCHC: 34.1 g/dL (ref 30.0–36.0)
MCV: 92.4 fL (ref 78.0–100.0)
Platelets: 246 10*3/uL (ref 150–400)
RBC: 4.35 MIL/uL (ref 3.87–5.11)
RDW: 13.5 % (ref 11.5–15.5)
WBC: 6.3 10*3/uL (ref 4.0–10.5)

## 2015-10-31 LAB — TYPE AND SCREEN
ABO/RH(D): O POS
Antibody Screen: NEGATIVE

## 2015-10-31 LAB — SURGICAL PCR SCREEN
MRSA, PCR: NEGATIVE
STAPHYLOCOCCUS AUREUS: NEGATIVE

## 2015-10-31 LAB — ABO/RH: ABO/RH(D): O POS

## 2015-10-31 SURGERY — POSTERIOR LUMBAR FUSION 2 LEVEL
Anesthesia: General | Site: Back

## 2015-10-31 MED ORDER — DIAZEPAM 5 MG PO TABS
5.0000 mg | ORAL_TABLET | Freq: Four times a day (QID) | ORAL | Status: DC | PRN
  Administered 2015-10-31 – 2015-11-02 (×5): 5 mg via ORAL
  Filled 2015-10-31 (×4): qty 1

## 2015-10-31 MED ORDER — SUFENTANIL CITRATE 50 MCG/ML IV SOLN
INTRAVENOUS | Status: DC | PRN
  Administered 2015-10-31 (×6): 5 ug via INTRAVENOUS
  Administered 2015-10-31: 10 ug via INTRAVENOUS
  Administered 2015-10-31 (×3): 5 ug via INTRAVENOUS
  Administered 2015-10-31: 10 ug via INTRAVENOUS
  Administered 2015-10-31: 5 ug via INTRAVENOUS
  Administered 2015-10-31: 10 ug via INTRAVENOUS

## 2015-10-31 MED ORDER — ACETAMINOPHEN 325 MG PO TABS: 650.0000 mg | ORAL_TABLET | ORAL | Status: DC | PRN

## 2015-10-31 MED ORDER — HYDROMORPHONE HCL 1 MG/ML IJ SOLN
INTRAMUSCULAR | Status: AC
  Administered 2015-10-31: 0.5 mg via INTRAVENOUS
  Filled 2015-10-31: qty 1

## 2015-10-31 MED ORDER — STERILE WATER FOR IRRIGATION IR SOLN
Status: DC | PRN
  Administered 2015-10-31: 1000 mL

## 2015-10-31 MED ORDER — LACTATED RINGERS IV SOLN
INTRAVENOUS | Status: DC | PRN
  Administered 2015-10-31 (×2): via INTRAVENOUS

## 2015-10-31 MED ORDER — ALUM & MAG HYDROXIDE-SIMETH 200-200-20 MG/5ML PO SUSP: 30.0000 mL | Freq: Four times a day (QID) | ORAL | Status: DC | PRN

## 2015-10-31 MED ORDER — SUFENTANIL CITRATE 50 MCG/ML IV SOLN
INTRAVENOUS | Status: AC
  Filled 2015-10-31: qty 1

## 2015-10-31 MED ORDER — MEPERIDINE HCL 25 MG/ML IJ SOLN
INTRAMUSCULAR | Status: AC
  Filled 2015-10-31: qty 1

## 2015-10-31 MED ORDER — LIDOCAINE 2% (20 MG/ML) 5 ML SYRINGE
INTRAMUSCULAR | Status: AC
  Filled 2015-10-31: qty 5

## 2015-10-31 MED ORDER — ROCURONIUM BROMIDE 50 MG/5ML IV SOLN
INTRAVENOUS | Status: AC
  Filled 2015-10-31: qty 1

## 2015-10-31 MED ORDER — OXYCODONE-ACETAMINOPHEN 5-325 MG PO TABS
1.0000 | ORAL_TABLET | ORAL | Status: DC | PRN
  Administered 2015-10-31 – 2015-11-02 (×10): 2 via ORAL
  Filled 2015-10-31 (×9): qty 2

## 2015-10-31 MED ORDER — LACTATED RINGERS IV SOLN
INTRAVENOUS | Status: DC
  Administered 2015-10-31: 10:00:00 via INTRAVENOUS

## 2015-10-31 MED ORDER — BISACODYL 10 MG RE SUPP: 10.0000 mg | Freq: Every day | RECTAL | Status: DC | PRN

## 2015-10-31 MED ORDER — LACTATED RINGERS IV SOLN: INTRAVENOUS | Status: DC

## 2015-10-31 MED ORDER — CEFAZOLIN SODIUM-DEXTROSE 2-4 GM/100ML-% IV SOLN
INTRAVENOUS | Status: AC
Start: 2015-10-31 — End: 2015-10-31
  Administered 2015-10-31 (×2): 2 g via INTRAVENOUS
  Filled 2015-10-31: qty 100

## 2015-10-31 MED ORDER — LIDOCAINE HCL (CARDIAC) 20 MG/ML IV SOLN
INTRAVENOUS | Status: DC | PRN
  Administered 2015-10-31: 60 mg via INTRAVENOUS

## 2015-10-31 MED ORDER — CYCLOBENZAPRINE HCL 10 MG PO TABS
10.0000 mg | ORAL_TABLET | Freq: Three times a day (TID) | ORAL | Status: DC | PRN
  Administered 2015-10-31: 10 mg via ORAL
  Filled 2015-10-31: qty 1

## 2015-10-31 MED ORDER — SUCCINYLCHOLINE CHLORIDE 200 MG/10ML IV SOSY
PREFILLED_SYRINGE | INTRAVENOUS | Status: AC
  Filled 2015-10-31: qty 10

## 2015-10-31 MED ORDER — PHENYLEPHRINE HCL 10 MG/ML IJ SOLN
10.0000 mg | INTRAVENOUS | Status: DC | PRN
  Administered 2015-10-31: 20 ug/min via INTRAVENOUS

## 2015-10-31 MED ORDER — ARTIFICIAL TEARS OP OINT
TOPICAL_OINTMENT | OPHTHALMIC | Status: AC
  Filled 2015-10-31: qty 3.5

## 2015-10-31 MED ORDER — PHENOL 1.4 % MT LIQD
1.0000 | OROMUCOSAL | Status: DC | PRN
Start: 2015-10-31 — End: 2015-11-02

## 2015-10-31 MED ORDER — FLUTICASONE PROPIONATE 50 MCG/ACT NA SUSP
1.0000 | Freq: Every day | NASAL | Status: DC
  Administered 2015-11-01 – 2015-11-02 (×2): 1 via NASAL
  Filled 2015-10-31: qty 16

## 2015-10-31 MED ORDER — BUPIVACAINE LIPOSOME 1.3 % IJ SUSP
INTRAMUSCULAR | Status: DC | PRN
  Administered 2015-10-31: 20 mL

## 2015-10-31 MED ORDER — ACETAMINOPHEN 650 MG RE SUPP: 650.0000 mg | RECTAL | Status: DC | PRN

## 2015-10-31 MED ORDER — PROPOFOL 10 MG/ML IV BOLUS
INTRAVENOUS | Status: DC | PRN
  Administered 2015-10-31: 150 mg via INTRAVENOUS

## 2015-10-31 MED ORDER — ROCURONIUM BROMIDE 100 MG/10ML IV SOLN
INTRAVENOUS | Status: DC | PRN
  Administered 2015-10-31: 20 mg via INTRAVENOUS
  Administered 2015-10-31: 10 mg via INTRAVENOUS
  Administered 2015-10-31: 50 mg via INTRAVENOUS
  Administered 2015-10-31: 10 mg via INTRAVENOUS

## 2015-10-31 MED ORDER — 0.9 % SODIUM CHLORIDE (POUR BTL) OPTIME
TOPICAL | Status: DC | PRN
  Administered 2015-10-31: 1000 mL

## 2015-10-31 MED ORDER — ARTIFICIAL TEARS OP OINT
TOPICAL_OINTMENT | OPHTHALMIC | Status: DC | PRN
  Administered 2015-10-31: 1 via OPHTHALMIC

## 2015-10-31 MED ORDER — ONDANSETRON HCL 4 MG/2ML IJ SOLN
INTRAMUSCULAR | Status: AC
  Filled 2015-10-31: qty 2

## 2015-10-31 MED ORDER — BACITRACIN 50000 UNITS IM SOLR
INTRAMUSCULAR | Status: DC | PRN
  Administered 2015-10-31: 500 mL

## 2015-10-31 MED ORDER — DOCUSATE SODIUM 100 MG PO CAPS
100.0000 mg | ORAL_CAPSULE | Freq: Two times a day (BID) | ORAL | Status: DC
  Administered 2015-10-31 – 2015-11-02 (×4): 100 mg via ORAL
  Filled 2015-10-31 (×4): qty 1

## 2015-10-31 MED ORDER — MEPERIDINE HCL 25 MG/ML IJ SOLN
6.2500 mg | INTRAMUSCULAR | Status: DC | PRN
  Administered 2015-10-31: 12.5 mg via INTRAVENOUS

## 2015-10-31 MED ORDER — MIDAZOLAM HCL 2 MG/2ML IJ SOLN
INTRAMUSCULAR | Status: AC
  Filled 2015-10-31: qty 2

## 2015-10-31 MED ORDER — ESTRADIOL 1 MG PO TABS
1.0000 mg | ORAL_TABLET | Freq: Every day | ORAL | Status: DC
  Administered 2015-10-31 – 2015-11-02 (×3): 1 mg via ORAL
  Filled 2015-10-31 (×3): qty 1

## 2015-10-31 MED ORDER — BUPIVACAINE-EPINEPHRINE (PF) 0.5% -1:200000 IJ SOLN
INTRAMUSCULAR | Status: DC | PRN
  Administered 2015-10-31: 10 mL

## 2015-10-31 MED ORDER — SUGAMMADEX SODIUM 200 MG/2ML IV SOLN
INTRAVENOUS | Status: DC | PRN
  Administered 2015-10-31: 200 mg via INTRAVENOUS

## 2015-10-31 MED ORDER — OXYCODONE-ACETAMINOPHEN 5-325 MG PO TABS
ORAL_TABLET | ORAL | Status: AC
  Administered 2015-10-31: 2 via ORAL
  Filled 2015-10-31: qty 2

## 2015-10-31 MED ORDER — PROMETHAZINE HCL 25 MG/ML IJ SOLN: 6.2500 mg | INTRAMUSCULAR | Status: DC | PRN

## 2015-10-31 MED ORDER — CEFAZOLIN SODIUM-DEXTROSE 2-4 GM/100ML-% IV SOLN: 2.0000 g | INTRAVENOUS | Status: DC

## 2015-10-31 MED ORDER — HYDROMORPHONE HCL 1 MG/ML IJ SOLN
0.2500 mg | INTRAMUSCULAR | Status: DC | PRN
  Administered 2015-10-31 (×4): 0.5 mg via INTRAVENOUS

## 2015-10-31 MED ORDER — AZELASTINE HCL 0.1 % NA SOLN
1.0000 | Freq: Every day | NASAL | Status: DC
  Filled 2015-10-31: qty 30

## 2015-10-31 MED ORDER — ONDANSETRON HCL 4 MG/2ML IJ SOLN
INTRAMUSCULAR | Status: DC | PRN
  Administered 2015-10-31: 4 mg via INTRAVENOUS

## 2015-10-31 MED ORDER — THROMBIN 20000 UNITS EX SOLR
CUTANEOUS | Status: DC | PRN
  Administered 2015-10-31 (×2): 20 mL via TOPICAL

## 2015-10-31 MED ORDER — MIDAZOLAM HCL 5 MG/5ML IJ SOLN
INTRAMUSCULAR | Status: DC | PRN
  Administered 2015-10-31: 2 mg via INTRAVENOUS

## 2015-10-31 MED ORDER — FENTANYL CITRATE (PF) 250 MCG/5ML IJ SOLN
INTRAMUSCULAR | Status: AC
  Filled 2015-10-31: qty 5

## 2015-10-31 MED ORDER — DIAZEPAM 5 MG PO TABS
ORAL_TABLET | ORAL | Status: AC
  Administered 2015-10-31: 5 mg via ORAL
  Filled 2015-10-31: qty 1

## 2015-10-31 MED ORDER — MENTHOL 3 MG MT LOZG: 1.0000 | LOZENGE | OROMUCOSAL | Status: DC | PRN

## 2015-10-31 MED ORDER — SODIUM CHLORIDE 0.9 % IJ SOLN
INTRAMUSCULAR | Status: AC
  Filled 2015-10-31: qty 10

## 2015-10-31 MED ORDER — CEFAZOLIN SODIUM-DEXTROSE 2-4 GM/100ML-% IV SOLN
2.0000 g | Freq: Three times a day (TID) | INTRAVENOUS | Status: AC
  Administered 2015-10-31 – 2015-11-01 (×2): 2 g via INTRAVENOUS
  Filled 2015-10-31 (×2): qty 100

## 2015-10-31 MED ORDER — BUPIVACAINE LIPOSOME 1.3 % IJ SUSP
20.0000 mL | INTRAMUSCULAR | Status: AC
  Filled 2015-10-31: qty 20

## 2015-10-31 MED ORDER — TRAMADOL HCL 50 MG PO TABS: 100.0000 mg | ORAL_TABLET | Freq: Four times a day (QID) | ORAL | Status: DC | PRN

## 2015-10-31 MED ORDER — AZELASTINE-FLUTICASONE 137-50 MCG/ACT NA SUSP: 1.0000 | Freq: Every day | NASAL | Status: DC

## 2015-10-31 MED ORDER — MORPHINE SULFATE (PF) 2 MG/ML IV SOLN: 1.0000 mg | INTRAVENOUS | Status: DC | PRN

## 2015-10-31 MED ORDER — PROPOFOL 10 MG/ML IV BOLUS
INTRAVENOUS | Status: AC
  Filled 2015-10-31: qty 20

## 2015-10-31 MED ORDER — SUGAMMADEX SODIUM 200 MG/2ML IV SOLN
INTRAVENOUS | Status: AC
  Filled 2015-10-31: qty 2

## 2015-10-31 MED ORDER — ONDANSETRON HCL 4 MG/2ML IJ SOLN: 4.0000 mg | INTRAMUSCULAR | Status: DC | PRN

## 2015-10-31 MED ORDER — VANCOMYCIN HCL 1000 MG IV SOLR
INTRAVENOUS | Status: DC | PRN
  Administered 2015-10-31: 1000 mg via TOPICAL

## 2015-10-31 MED ORDER — HYDROCODONE-ACETAMINOPHEN 5-325 MG PO TABS: 1.0000 | ORAL_TABLET | ORAL | Status: DC | PRN

## 2015-10-31 MED ORDER — VANCOMYCIN HCL 1000 MG IV SOLR
INTRAVENOUS | Status: AC
  Filled 2015-10-31: qty 1000

## 2015-10-31 SURGICAL SUPPLY — 74 items
BAG DECANTER FOR FLEXI CONT (MISCELLANEOUS) ×3 IMPLANT
BENZOIN TINCTURE PRP APPL 2/3 (GAUZE/BANDAGES/DRESSINGS) ×3 IMPLANT
BLADE CLIPPER SURG (BLADE) IMPLANT
BRUSH SCRUB EZ PLAIN DRY (MISCELLANEOUS) ×3 IMPLANT
BUR MATCHSTICK NEURO 3.0 LAGG (BURR) ×3 IMPLANT
BUR PRECISION FLUTE 6.0 (BURR) ×3 IMPLANT
CAGE ALTERA 10X31MM-10-14-15 (Cage) ×2 IMPLANT
CAGE ALTERA 10X31X10-14 15D (Cage) ×4 IMPLANT
CANISTER SUCT 3000ML PPV (MISCELLANEOUS) ×3 IMPLANT
CAP REVERE LOCKING (Cap) ×18 IMPLANT
CATH FOLEY 2WAY SLVR  5CC 14FR (CATHETERS) ×2
CATH FOLEY 2WAY SLVR 5CC 14FR (CATHETERS) ×1 IMPLANT
CLOSURE WOUND 1/2 X4 (GAUZE/BANDAGES/DRESSINGS) ×1
CONT SPEC 4OZ CLIKSEAL STRL BL (MISCELLANEOUS) ×6 IMPLANT
COVER BACK TABLE 60X90IN (DRAPES) ×3 IMPLANT
DRAPE C-ARM 42X72 X-RAY (DRAPES) ×6 IMPLANT
DRAPE LAPAROTOMY 100X72X124 (DRAPES) ×3 IMPLANT
DRAPE POUCH INSTRU U-SHP 10X18 (DRAPES) ×3 IMPLANT
DRAPE PROXIMA HALF (DRAPES) ×6 IMPLANT
DRAPE SURG 17X23 STRL (DRAPES) ×12 IMPLANT
ELECT BLADE 4.0 EZ CLEAN MEGAD (MISCELLANEOUS) ×3
ELECT REM PT RETURN 9FT ADLT (ELECTROSURGICAL) ×3
ELECTRODE BLDE 4.0 EZ CLN MEGD (MISCELLANEOUS) ×1 IMPLANT
ELECTRODE REM PT RTRN 9FT ADLT (ELECTROSURGICAL) ×1 IMPLANT
GAUZE SPONGE 4X4 12PLY STRL (GAUZE/BANDAGES/DRESSINGS) ×3 IMPLANT
GAUZE SPONGE 4X4 16PLY XRAY LF (GAUZE/BANDAGES/DRESSINGS) ×3 IMPLANT
GLOVE BIO SURGEON STRL SZ 6.5 (GLOVE) ×2 IMPLANT
GLOVE BIO SURGEON STRL SZ8 (GLOVE) ×6 IMPLANT
GLOVE BIO SURGEON STRL SZ8.5 (GLOVE) ×6 IMPLANT
GLOVE BIO SURGEONS STRL SZ 6.5 (GLOVE) ×1
GLOVE BIOGEL PI IND STRL 6.5 (GLOVE) ×1 IMPLANT
GLOVE BIOGEL PI IND STRL 8 (GLOVE) ×1 IMPLANT
GLOVE BIOGEL PI INDICATOR 6.5 (GLOVE) ×2
GLOVE BIOGEL PI INDICATOR 8 (GLOVE) ×2
GLOVE ECLIPSE 7.5 STRL STRAW (GLOVE) ×3 IMPLANT
GLOVE EXAM NITRILE LRG STRL (GLOVE) IMPLANT
GLOVE EXAM NITRILE MD LF STRL (GLOVE) ×9 IMPLANT
GLOVE EXAM NITRILE XL STR (GLOVE) IMPLANT
GLOVE EXAM NITRILE XS STR PU (GLOVE) IMPLANT
GLOVE INDICATOR 6.5 STRL GRN (GLOVE) ×6 IMPLANT
GLOVE SURG SS PI 6.5 STRL IVOR (GLOVE) ×18 IMPLANT
GOWN STRL REUS W/ TWL LRG LVL3 (GOWN DISPOSABLE) ×2 IMPLANT
GOWN STRL REUS W/ TWL XL LVL3 (GOWN DISPOSABLE) ×5 IMPLANT
GOWN STRL REUS W/TWL 2XL LVL3 (GOWN DISPOSABLE) IMPLANT
GOWN STRL REUS W/TWL LRG LVL3 (GOWN DISPOSABLE) ×4
GOWN STRL REUS W/TWL XL LVL3 (GOWN DISPOSABLE) ×10
KIT BASIN OR (CUSTOM PROCEDURE TRAY) ×3 IMPLANT
KIT ROOM TURNOVER OR (KITS) ×3 IMPLANT
MILL MEDIUM DISP (BLADE) ×3 IMPLANT
NEEDLE HYPO 21X1.5 SAFETY (NEEDLE) ×3 IMPLANT
NEEDLE HYPO 22GX1.5 SAFETY (NEEDLE) ×3 IMPLANT
NS IRRIG 1000ML POUR BTL (IV SOLUTION) ×3 IMPLANT
PACK LAMINECTOMY NEURO (CUSTOM PROCEDURE TRAY) ×3 IMPLANT
PAD ARMBOARD 7.5X6 YLW CONV (MISCELLANEOUS) ×9 IMPLANT
PATTIES SURGICAL .5 X.5 (GAUZE/BANDAGES/DRESSINGS) ×3 IMPLANT
PATTIES SURGICAL .5 X1 (DISPOSABLE) ×3 IMPLANT
PATTIES SURGICAL 1X1 (DISPOSABLE) ×6 IMPLANT
ROD REVERE CURVED 65MM (Rod) ×6 IMPLANT
SCREW 7.5X50MM (Screw) ×12 IMPLANT
SCREW REVERE 6.35 75X55MM (Screw) ×6 IMPLANT
SPONGE LAP 4X18 X RAY DECT (DISPOSABLE) IMPLANT
SPONGE NEURO XRAY DETECT 1X3 (DISPOSABLE) ×3 IMPLANT
SPONGE SURGIFOAM ABS GEL 100 (HEMOSTASIS) ×3 IMPLANT
STRIP BIOACTIVE 20CC 25X100X8 (Miscellaneous) ×6 IMPLANT
STRIP CLOSURE SKIN 1/2X4 (GAUZE/BANDAGES/DRESSINGS) ×2 IMPLANT
SUT VIC AB 1 CT1 18XBRD ANBCTR (SUTURE) ×2 IMPLANT
SUT VIC AB 1 CT1 8-18 (SUTURE) ×4
SUT VIC AB 2-0 CP2 18 (SUTURE) ×6 IMPLANT
SYR 20CC LL (SYRINGE) ×3 IMPLANT
TAPE CLOTH SURG 4X10 WHT LF (GAUZE/BANDAGES/DRESSINGS) ×3 IMPLANT
TOWEL OR 17X24 6PK STRL BLUE (TOWEL DISPOSABLE) ×3 IMPLANT
TOWEL OR 17X26 10 PK STRL BLUE (TOWEL DISPOSABLE) ×3 IMPLANT
TRAY FOLEY W/METER SILVER 16FR (SET/KITS/TRAYS/PACK) ×6 IMPLANT
WATER STERILE IRR 1000ML POUR (IV SOLUTION) ×3 IMPLANT

## 2015-10-31 NOTE — H&P (Signed)
Subjective: The patient is a 65 year old white female who has complained of back, buttock, and leg pain consistent with neurogenic claudication. He has failed medical management and was worked up with a lumbar MRI. This demonstrated a spondylolisthesis and spinal stenosis at L3-4 and L4-5. I discussed the various treatment options with the patient. She has weighed the risks, benefits, and alternative surgery and decided to proceed with an L3-4 and L4-5 decompression, instrumentation, and fusion.   Past Medical History  Diagnosis Date  . Arthritis   . Frequent urination   . Frequent urination at night     Past Surgical History  Procedure Laterality Date  . Removal milk duct Left 1985  . Buninectomy Right 2005  . Hysterectomy abdominal with salpingectomy  2000  . Colonoscopy    . Conezation      No Known Allergies  Social History  Substance Use Topics  . Smoking status: Former Smoker -- 10 years  . Smokeless tobacco: Not on file     Comment: quit at age 19  . Alcohol Use: 1.2 oz/week    2 Glasses of wine per week    History reviewed. No pertinent family history. Prior to Admission medications   Medication Sig Start Date End Date Taking? Authorizing Provider  Azelastine-Fluticasone (DYMISTA) 137-50 MCG/ACT SUSP Place 1 spray into the nose daily.   Yes Historical Provider, MD  cyclobenzaprine (FLEXERIL) 10 MG tablet Take 10 mg by mouth 3 (three) times daily as needed for muscle spasms.   Yes Historical Provider, MD  estradiol (ESTRACE) 1 MG tablet Take 1 mg by mouth daily.   Yes Historical Provider, MD  traMADol (ULTRAM) 50 MG tablet Take by mouth every 6 (six) hours as needed.   Yes Historical Provider, MD     Review of Systems  Positive ROS: As above  All other systems have been reviewed and were otherwise negative with the exception of those mentioned in the HPI and as above.  Objective: Vital signs in last 24 hours: Temp:  [98.2 F (36.8 C)] 98.2 F (36.8 C) (07/10  0930) Pulse Rate:  [80] 80 (07/10 0930) Resp:  [20] 20 (07/10 0930) BP: (174)/(74) 174/74 mmHg (07/10 0930) SpO2:  [94 %] 94 % (07/10 0930) Weight:  [85.276 kg (188 lb)] 85.276 kg (188 lb) (07/10 0930)  General Appearance: Alert, cooperative, no distress, Head: Normocephalic, without obvious abnormality, atraumatic Eyes: PERRL, conjunctiva/corneas clear, EOM's intact,    Ears: Normal  Throat: Normal  Neck: Supple, symmetrical, trachea midline, no adenopathy; thyroid: No enlargement/tenderness/nodules; no carotid bruit or JVD Back: Symmetric, no curvature, ROM normal, no CVA tenderness Lungs: Clear to auscultation bilaterally, respirations unlabored Heart: Regular rate and rhythm, no murmur, rub or gallop Abdomen: Soft, non-tender,, no masses, no organomegaly Extremities: Extremities normal, atraumatic, no cyanosis or edema Pulses: 2+ and symmetric all extremities Skin: Skin color, texture, turgor normal, no rashes or lesions  NEUROLOGIC:   Mental status: alert and oriented, no aphasia, good attention span, Fund of knowledge/ memory ok Motor Exam - grossly normal Sensory Exam - grossly normal Reflexes:  Coordination - grossly normal Gait - grossly normal Balance - grossly normal Cranial Nerves: I: smell Not tested  II: visual acuity  OS: Normal  OD: Normal   II: visual fields Full to confrontation  II: pupils Equal, round, reactive to light  III,VII: ptosis None  III,IV,VI: extraocular muscles  Full ROM  V: mastication Normal  V: facial light touch sensation  Normal  V,VII: corneal reflex  Present  VII: facial muscle function - upper  Normal  VII: facial muscle function - lower Normal  VIII: hearing Not tested  IX: soft palate elevation  Normal  IX,X: gag reflex Present  XI: trapezius strength  5/5  XI: sternocleidomastoid strength 5/5  XI: neck flexion strength  5/5  XII: tongue strength  Normal    Data Review Lab Results  Component Value Date   WBC 6.3  10/31/2015   HGB 13.7 10/31/2015   HCT 40.2 10/31/2015   MCV 92.4 10/31/2015   PLT 246 10/31/2015   Lab Results  Component Value Date   NA 137 10/31/2015   K 3.7 10/31/2015   CL 104 10/31/2015   CO2 24 10/31/2015   BUN 14 10/31/2015   CREATININE 0.70 10/31/2015   GLUCOSE 97 10/31/2015   No results found for: INR, PROTIME  Assessment/Plan: L3-4 and L4-5 spondylolisthesis, spinal stenosis, facet arthropathy, lumbago, lumbar radiculopathy, neurogenic claudication: I discussed the situation with the patient and reviewed her imaging studies with her. We have discussed the various treatment options including surgery. I have described the surgical treatment option of an L3-4 and L4-5 decompression, instrumentation, and fusion. I have shown her surgical models. We have discussed the risks, benefits, alternatives, expected postoperative course, and likelihood of achieving goals with surgery. I have answered all her questions. She has decided to proceed with surgery.   Keanon Bevins D 10/31/2015 11:54 AM

## 2015-10-31 NOTE — Op Note (Signed)
Brief history: The patient is a 65 year old white female who has complained of back and leg pain consistent with neurogenic claudication. She has failed medical management and was worked up with a lumbar MRI and lumbar x-rays. These demonstrated an L3-4 and L4-5 spondylolisthesis, spinal stenosis, severe facet arthropathy, etc. I discussed the situation with the patient. We discussed the various treatment options include surgery. She has decided to proceed with an L3-4 and L4-5 decompression, instrumentation, and fusion after weighing the risks, benefits, and alternatives to surgery.  Preoperative diagnosis: L3-4 and L4-5 spondylolisthesis, facet arthropathy, spinal stenosis compressing the L3, L4 and L5 nerve roots; lumbago; lumbar radiculopathy  Postoperative diagnosis: The same  Procedure: Bilateral L3-4 and L4-5 Laminotomy/foraminotomies to decompress the bilateral L3, L4 and L5 nerve roots(the work required to do this was in addition to the work required to do the posterior lumbar interbody fusion because of the patient's spinal stenosis, facet arthropathy. Etc. requiring a wide decompression of the nerve roots.); L3-4 and L4-5 transforaminal lumbar interbody fusion with local morselized autograft bone and Kinnex graft extender; insertion of interbody prosthesis at L3-4 and L4-5 (globus peek expandable interbody prosthesis); posterior segmental instrumentation from L3 to L5 with globus titanium pedicle screws and rods; posterior lateral arthrodesis at L3-4 and L4-5 with local morselized autograft bone and Kinnex bone graft extender.  Surgeon: Dr. Delma OfficerJeff Mekhi Sonn  Asst.: Dr. Shirlean Kellyobert Nudelman  Anesthesia: Gen. endotracheal  Estimated blood loss: 250 mL  Drains: None  Complications: None  Description of procedure: The patient was brought to the operating room by the anesthesia team. General endotracheal anesthesia was induced. The patient was turned to the prone position on the Wilson frame. The  patient's lumbosacral region was then prepared with Betadine scrub and Betadine solution. Sterile drapes were applied.  I then injected the area to be incised with Marcaine with epinephrine solution. I then used the scalpel to make a linear midline incision over the L3-4 and L4-5 interspace. I then used electrocautery to perform a bilateral subperiosteal dissection exposing the spinous process and lamina of L3-4 and L4-5. We then obtained intraoperative radiograph to confirm our location. We then inserted the Verstrac retractor to provide exposure.  I began the decompression by using the high speed drill to perform laminotomies at L3-4 and L4-5 bilaterally. The patient has severe facet arthropathy. We then used the Kerrison punches to widen the laminotomy and removed the ligamentum flavum at L3-4 and L4-5 bilaterally. We used the Kerrison punches to remove the medial facets at L3-4 and L4-5 bilaterally. We performed wide foraminotomies about the bilateral L3, L4 and L5 nerve roots completing the decompression.  We now turned our attention to the posterior lumbar interbody fusion. I used a scalpel to incise the intervertebral disc at L3-4 and L4-5 bilaterally. I then performed a partial intervertebral discectomy at L3-4 and L4-5 bilaterally using the pituitary forceps. We prepared the vertebral endplates at L3-4 and L4-5 bilaterally for the fusion by removing the soft tissues with the curettes. We then used the trial spacers to pick the appropriate sized interbody prosthesis. We prefilled his prosthesis with a combination of local morselized autograft bone that we obtained during the decompression as well as Kinnex bone graft extender. We inserted the prefilled prosthesis into the interspace at L3-4 and L4-5, we then expanded the prosthesis. There was a good snug fit of the prosthesis in the interspace. We then filled and the remainder of the intervertebral disc space with local morselized autograft bone and  Kinnex. This  completed the posterior lumbar interbody arthrodesis.  We now turned attention to the instrumentation. Under fluoroscopic guidance we cannulated the bilateral L3, L4 and L5 pedicles with the bone probe. We then removed the bone probe. We then tapped the pedicle with a 6.5 millimeter tap. We then removed the tap. We probed inside the tapped pedicle with a ball probe to rule out cortical breaches. We then inserted a 7.5 x 50 and 55 millimeter pedicle screw into the L3, L4 and L5 pedicles bilaterally under fluoroscopic guidance. We then palpated along the medial aspect of the pedicles to rule out cortical breaches. There were none. The nerve roots were not injured. We then connected the unilateral pedicle screws with a lordotic rod. We compressed the construct and secured the rod in place with the caps. We then tightened the caps appropriately. This completed the instrumentation from L3-L5.  We now turned our attention to the posterior lateral arthrodesis at L3-4 and L4-5. We used the high-speed drill to decorticate the remainder of the facets, pars, transverse process at L3-4 and L4-5. We then applied a combination of local morselized autograft bone and Kinnex bone graft extender over these decorticated posterior lateral structures. This completed the posterior lateral arthrodesis.  We then obtained hemostasis using bipolar electrocautery. We irrigated the wound out with bacitracin solution. We inspected the thecal sac and nerve roots and noted they were well decompressed. We then removed the retractor. We placed vancomycin powder in the wound. We reapproximated patient's thoracolumbar fascia with interrupted #1 Vicryl suture. We reapproximated patient's subcutaneous tissue with interrupted 2-0 Vicryl suture. The reapproximated patient's skin with Steri-Strips and benzoin. The wound was then coated with bacitracin ointment. A sterile dressing was applied. The drapes were removed. The patient was  subsequently returned to the supine position where they were extubated by the anesthesia team. He was then transported to the post anesthesia care unit in stable condition. All sponge instrument and needle counts were reportedly correct at the end of this case.

## 2015-10-31 NOTE — Anesthesia Preprocedure Evaluation (Addendum)
Anesthesia Evaluation  Patient identified by MRN, date of birth, ID band Patient awake    Reviewed: Allergy & Precautions, NPO status , Patient's Chart, lab work & pertinent test results  Airway Mallampati: I  TM Distance: >3 FB Neck ROM: Full    Dental  (+) Teeth Intact, Dental Advisory Given   Pulmonary former smoker,    breath sounds clear to auscultation       Cardiovascular negative cardio ROS   Rhythm:Regular Rate:Normal     Neuro/Psych negative neurological ROS  negative psych ROS   GI/Hepatic negative GI ROS, Neg liver ROS,   Endo/Other  negative endocrine ROS  Renal/GU negative Renal ROS  negative genitourinary   Musculoskeletal  (+) Arthritis ,   Abdominal (+) + obese,   Peds negative pediatric ROS (+)  Hematology negative hematology ROS (+)   Anesthesia Other Findings   Reproductive/Obstetrics negative OB ROS                            Lab Results  Component Value Date   WBC 6.3 10/31/2015   HGB 13.7 10/31/2015   HCT 40.2 10/31/2015   MCV 92.4 10/31/2015   PLT 246 10/31/2015   No results found for: CREATININE, BUN, NA, K, CL, CO2  No results found for: INR, PROTIME   Anesthesia Physical Anesthesia Plan  ASA: II  Anesthesia Plan: General   Post-op Pain Management:    Induction: Intravenous  Airway Management Planned: Oral ETT  Additional Equipment:   Intra-op Plan:   Post-operative Plan: Extubation in OR  Informed Consent: I have reviewed the patients History and Physical, chart, labs and discussed the procedure including the risks, benefits and alternatives for the proposed anesthesia with the patient or authorized representative who has indicated his/her understanding and acceptance.   Dental advisory given  Plan Discussed with: CRNA  Anesthesia Plan Comments:         Anesthesia Quick Evaluation

## 2015-10-31 NOTE — Anesthesia Procedure Notes (Signed)
Procedure Name: Intubation Date/Time: 10/31/2015 12:44 PM Performed by: Coralee RudFLORES, Giordano Getman Pre-anesthesia Checklist: Patient identified, Emergency Drugs available and Suction available Patient Re-evaluated:Patient Re-evaluated prior to inductionOxygen Delivery Method: Circle system utilized Preoxygenation: Pre-oxygenation with 100% oxygen Intubation Type: IV induction Ventilation: Mask ventilation without difficulty Laryngoscope Size: Miller and 3 Grade View: Grade I Tube type: Oral Tube size: 7.5 mm Number of attempts: 1 Airway Equipment and Method: Stylet Placement Confirmation: ETT inserted through vocal cords under direct vision,  positive ETCO2 and breath sounds checked- equal and bilateral Secured at: 22 cm Tube secured with: Tape Dental Injury: Teeth and Oropharynx as per pre-operative assessment

## 2015-10-31 NOTE — Progress Notes (Signed)
Subjective:  The patient is somnolent but easily arousable. She is in no apparent distress. She looks well.  Objective: Vital signs in last 24 hours: Temp:  [97.9 F (36.6 C)-98.2 F (36.8 C)] 97.9 F (36.6 C) (07/10 1729) Pulse Rate:  [80-93] 88 (07/10 1733) Resp:  [16-36] 16 (07/10 1733) BP: (138-174)/(74-99) 138/99 mmHg (07/10 1733) SpO2:  [94 %-97 %] 97 % (07/10 1733) Weight:  [85.276 kg (188 lb)] 85.276 kg (188 lb) (07/10 0930)  Intake/Output from previous day:   Intake/Output this shift: Total I/O In: 2000 [I.V.:2000] Out: 1690 [Urine:1450; Blood:240]  Physical exam the patient is somnolent but arousable. She is moving her lower extremities well.  Lab Results:  Recent Labs  10/31/15 0959  WBC 6.3  HGB 13.7  HCT 40.2  PLT 246   BMET  Recent Labs  10/31/15 0959  NA 137  K 3.7  CL 104  CO2 24  GLUCOSE 97  BUN 14  CREATININE 0.70  CALCIUM 9.4    Studies/Results: Dg Lumbar Spine 2-3 Views  10/31/2015  CLINICAL DATA:  L3-5 internal fixation. EXAM: LUMBAR SPINE - 2-3 VIEW; DG C-ARM 61-120 MIN COMPARISON:  None. FINDINGS: AP and lateral intraoperative fluoroscopic views show transpedicular fixation screws at the L3 through L5 levels, appropriately positioned with intervening disc spacers/cages. No evidence of surgical complicating feature demonstrated. IMPRESSION: Intraoperative fluoroscopic spot images showing transpedicular fixation screws at the L3 through L5 levels, with intervening disc spacer/cages, without evidence of surgical complicating feature. Electronically Signed   By: Bary RichardStan  Maynard M.D.   On: 10/31/2015 17:07   Dg Lumbar Spine 1 View  10/31/2015  CLINICAL DATA:  Intraoperative radiograph from posterior L3-4 and L4-5 fusion. EXAM: LUMBAR SPINE - 1 VIEW COMPARISON:  MRI of the lumbosacral spine 03/24/2015 FINDINGS: Single lateral view of the lumbosacral spine demonstrates surgical instrument at the level of L4-L5 intervertebral disc. Tissue retractor  and surgical sponges are noted. IMPRESSION: Intraoperative radiograph with surgical instrument at L4-L5 intervertebral disc level. Electronically Signed   By: Ted Mcalpineobrinka  Dimitrova M.D.   On: 10/31/2015 17:24   Dg C-arm 61-120 Min  10/31/2015  CLINICAL DATA:  L3-5 internal fixation. EXAM: LUMBAR SPINE - 2-3 VIEW; DG C-ARM 61-120 MIN COMPARISON:  None. FINDINGS: AP and lateral intraoperative fluoroscopic views show transpedicular fixation screws at the L3 through L5 levels, appropriately positioned with intervening disc spacers/cages. No evidence of surgical complicating feature demonstrated. IMPRESSION: Intraoperative fluoroscopic spot images showing transpedicular fixation screws at the L3 through L5 levels, with intervening disc spacer/cages, without evidence of surgical complicating feature. Electronically Signed   By: Bary RichardStan  Maynard M.D.   On: 10/31/2015 17:07    Assessment/Plan: The patient is doing well.  LOS: 0 days     Peniel Hass D 10/31/2015, 5:46 PM

## 2015-10-31 NOTE — Progress Notes (Signed)
Notified Dr. Hart RochesterHollis  of ring on pt's left ringer will not come off.

## 2015-10-31 NOTE — Transfer of Care (Signed)
Immediate Anesthesia Transfer of Care Note  Patient: Angela Walters  Procedure(s) Performed: Procedure(s) with comments: Posterior Lumbar Three-Four/Four-Five Interbody and Fusion with Posterior Segmenal Instrumentation and Posteriolateral Arthrodesis (N/A) - L3-4 L4-5 Posterior lumbar interbody fusion/Interbody prosthesis/posterior segmental instrumentation/posterior lumbar arthrodesis  Patient Location: PACU  Anesthesia Type:General  Level of Consciousness: awake, oriented and patient cooperative  Airway & Oxygen Therapy: Patient Spontanous Breathing and Patient connected to nasal cannula oxygen  Post-op Assessment: Report given to RN, Post -op Vital signs reviewed and stable, Patient moving all extremities and Patient moving all extremities X 4  Post vital signs: Reviewed and stable  Last Vitals:  Filed Vitals:   10/31/15 1729 10/31/15 1733  BP:  138/99  Pulse: 93 88  Temp: 36.6 C   Resp: 36 16    Last Pain: There were no vitals filed for this visit.    Patients Stated Pain Goal: 5 (10/31/15 1001)  Complications: No apparent anesthesia complications

## 2015-11-01 ENCOUNTER — Other Ambulatory Visit: Payer: BC Managed Care – PPO

## 2015-11-01 ENCOUNTER — Ambulatory Visit: Payer: BC Managed Care – PPO

## 2015-11-01 LAB — BASIC METABOLIC PANEL
ANION GAP: 4 — AB (ref 5–15)
BUN: 8 mg/dL (ref 6–20)
CHLORIDE: 103 mmol/L (ref 101–111)
CO2: 29 mmol/L (ref 22–32)
Calcium: 8.9 mg/dL (ref 8.9–10.3)
Creatinine, Ser: 0.72 mg/dL (ref 0.44–1.00)
GFR calc non Af Amer: >60 mL/min (ref >60)
Glucose, Bld: 126 mg/dL — ABNORMAL HIGH (ref 65–99)
POTASSIUM: 4 mmol/L (ref 3.5–5.1)
SODIUM: 136 mmol/L (ref 135–145)

## 2015-11-01 LAB — CBC
HEMATOCRIT: 34.8 % — AB (ref 36.0–46.0)
HEMOGLOBIN: 11.6 g/dL — AB (ref 12.0–15.0)
MCH: 31.3 pg (ref 26.0–34.0)
MCHC: 33.3 g/dL (ref 30.0–36.0)
MCV: 93.8 fL (ref 78.0–100.0)
Platelets: 215 10*3/uL (ref 150–400)
RBC: 3.71 MIL/uL — AB (ref 3.87–5.11)
RDW: 13.6 % (ref 11.5–15.5)
WBC: 9.2 10*3/uL (ref 4.0–10.5)

## 2015-11-01 MED FILL — Sodium Chloride IV Soln 0.9%: INTRAVENOUS | Qty: 1000 | Status: AC

## 2015-11-01 MED FILL — Heparin Sodium (Porcine) Inj 1000 Unit/ML: INTRAMUSCULAR | Qty: 30 | Status: AC

## 2015-11-01 NOTE — Progress Notes (Signed)
Patient ID: Angela Walters, female   DOB: 1950/05/27, [age] y.o.   MRN: [id number] Subjective:  The patient is alert and pleasant. She looks well. She has no complaints. She is appropriately sore.  Objective: Vital signs in last 24 hours: Temp:  [97.7 F (36.5 C)-98.7 F (37.1 C)] 98.1 F (36.7 C) (07/11 0340) Pulse Rate:  [68-93] 68 (07/11 0340) Resp:  [16-36] 18 (07/11 0340) BP: (114-174)/(48-99) 114/53 mmHg (07/11 0340) SpO2:  [94 %-99 %] 97 % (07/11 0340) Weight:  [85.276 kg (188 lb)] 85.276 kg (188 lb) (07/10 0930)  Intake/Output from previous day: 07/10 0701 - 07/11 0700 In: 2000 [I.V.:2000] Out: 3440 [Urine:3200; Blood:240] Intake/Output this shift:    Physical exam the patient is alert and pleasant. Her strength is grossly normal in her lower extremities.  Lab Results:  Recent Labs  10/31/15 0959 11/01/15 0102  WBC 6.3 9.2  HGB 13.7 11.6*  HCT 40.2 34.8*  PLT 246 215   BMET  Recent Labs  10/31/15 0959 11/01/15 0102  NA 137 136  K 3.7 4.0  CL 104 103  CO2 24 29  GLUCOSE 97 126*  BUN 14 8  CREATININE 0.70 0.72  CALCIUM 9.4 8.9    Studies/Results: Dg Lumbar Spine 2-3 Views  10/31/2015  CLINICAL DATA:  L3-5 internal fixation. EXAM: LUMBAR SPINE - 2-3 VIEW; DG C-ARM 61-120 MIN COMPARISON:  None. FINDINGS: AP and lateral intraoperative fluoroscopic views show transpedicular fixation screws at the L3 through L5 levels, appropriately positioned with intervening disc spacers/cages. No evidence of surgical complicating feature demonstrated. IMPRESSION: Intraoperative fluoroscopic spot images showing transpedicular fixation screws at the L3 through L5 levels, with intervening disc spacer/cages, without evidence of surgical complicating feature. Electronically Signed   By: Bary RichardStan  Maynard M.Walters.   On: 10/31/2015 17:07   Dg Lumbar Spine 1 View  10/31/2015  CLINICAL DATA:  Intraoperative radiograph from posterior L3-4 and L4-5 fusion. EXAM: LUMBAR SPINE - 1 VIEW COMPARISON:   MRI of the lumbosacral spine 03/24/2015 FINDINGS: Single lateral view of the lumbosacral spine demonstrates surgical instrument at the level of L4-L5 intervertebral disc. Tissue retractor and surgical sponges are noted. IMPRESSION: Intraoperative radiograph with surgical instrument at L4-L5 intervertebral disc level. Electronically Signed   By: Ted Mcalpineobrinka  Dimitrova M.Walters.   On: 10/31/2015 17:24   Dg C-arm 61-120 Min  10/31/2015  CLINICAL DATA:  L3-5 internal fixation. EXAM: LUMBAR SPINE - 2-3 VIEW; DG C-ARM 61-120 MIN COMPARISON:  None. FINDINGS: AP and lateral intraoperative fluoroscopic views show transpedicular fixation screws at the L3 through L5 levels, appropriately positioned with intervening disc spacers/cages. No evidence of surgical complicating feature demonstrated. IMPRESSION: Intraoperative fluoroscopic spot images showing transpedicular fixation screws at the L3 through L5 levels, with intervening disc spacer/cages, without evidence of surgical complicating feature. Electronically Signed   By: Bary RichardStan  Maynard M.Walters.   On: 10/31/2015 17:07    Assessment/Plan: Postop day #1: The patient is doing well. We will mobilize her with PT. She may go home tomorrow.  LOS: 1 day     Angela Walters 11/01/2015, 7:21 AM

## 2015-11-01 NOTE — Anesthesia Postprocedure Evaluation (Signed)
Anesthesia Post Note  Patient: Angela Walters  Procedure(s) Performed: Procedure(s) (LRB): Posterior Lumbar Three-Four/Four-Five Interbody and Fusion with Posterior Segmenal Instrumentation and Posteriolateral Arthrodesis (N/A)  Patient location during evaluation: PACU Anesthesia Type: General Level of consciousness: awake and alert Pain management: pain level controlled Vital Signs Assessment: post-procedure vital signs reviewed and stable Respiratory status: spontaneous breathing, nonlabored ventilation, respiratory function stable and patient connected to nasal cannula oxygen Cardiovascular status: blood pressure returned to baseline and stable Postop Assessment: no signs of nausea or vomiting Anesthetic complications: no    Last Vitals:  Filed Vitals:   11/01/15 0340 11/01/15 0750  BP: 114/53 129/54  Pulse: 68 70  Temp: 36.7 C 36.8 C  Resp: 18 18    Last Pain:  Filed Vitals:   11/01/15 0800  PainSc: 6                  Shelton SilvasKevin D Hollis

## 2015-11-01 NOTE — Evaluation (Signed)
Physical Therapy Evaluation Patient Details Name: Angela Walters MRN: [id number] DOB: June 23, 1950 Today's Date: 11/01/2015   History of Present Illness  Pt is a 65 y/o female who presents s/p L3-L5 PLIF on 10/31/15.  Clinical Impression  Pt admitted with above diagnosis. Pt currently with functional limitations due to the deficits listed below (see PT Problem List). At the time of PT eval pt was able to perform transfers and ambulation with gross supervision for safety. Pt will benefit from skilled PT to increase their independence and safety with mobility to allow discharge to the venue listed below.       Follow Up Recommendations Outpatient PT;Supervision for mobility/OOB    Equipment Recommendations  3in1 (PT)    Recommendations for Other Services       Precautions / Restrictions Precautions Precautions: Fall;Back Precaution Booklet Issued: Yes (comment) Precaution Comments: Reviewed handout and pt was cued for precautions during functional mobility.  Required Braces or Orthoses: Spinal Brace Spinal Brace: Lumbar corset;Applied in sitting position Restrictions Weight Bearing Restrictions: No      Mobility  Bed Mobility Overal bed mobility: Needs Assistance Bed Mobility: Rolling;Sit to Sidelying Rolling: Modified independent (Device/Increase time)       Sit to sidelying: Supervision General bed mobility comments: VC's for log roll technique. Pt was able to transition from sitting to supine with increased time.  Transfers Overall transfer level: Needs assistance Equipment used: None Transfers: Sit to/from Stand Sit to Stand: Supervision         General transfer comment: Pt able to power-up to full standing position without assistance. VC's for maintenance of precautions during transfers.  Ambulation/Gait Ambulation/Gait assistance: Supervision Ambulation Distance (Feet): 400 Feet Assistive device: None Gait Pattern/deviations: Step-through pattern;Decreased  stride length Gait velocity: Decreased Gait velocity interpretation: Below normal speed for age/gender General Gait Details: Pt was able to walk the full length of the hall with no assistance. Close supervision for safety as pt moving very slow and guarded.   Stairs Stairs: Yes Stairs assistance: Min guard Stair Management: One rail Right;Step to pattern;Forwards Number of Stairs: 10 General stair comments: VC's for sequencing and general safety.   Wheelchair Mobility    Modified Rankin (Stroke Patients Only)       Balance Overall balance assessment: Needs assistance Sitting-balance support: Feet supported;No upper extremity supported Sitting balance-Leahy Scale: Fair     Standing balance support: No upper extremity supported;During functional activity Standing balance-Leahy Scale: Fair                               Pertinent Vitals/Pain Pain Assessment: Faces Faces Pain Scale: Hurts little more Pain Location: Incision site Pain Descriptors / Indicators: Operative site guarding;Sore Pain Intervention(s): Limited activity within patient's tolerance;Monitored during session;Repositioned    Home Living Family/patient expects to be discharged to:: Private residence Living Arrangements: Spouse/significant other Available Help at Discharge: Family;Available 24 hours/day Type of Home: House Home Access: Stairs to enter Entrance Stairs-Rails: Doctor, general practiceight;Left Entrance Stairs-Number of Steps: 7 Home Layout: Two level;Able to live on main level with bedroom/bathroom Home Equipment: Dan HumphreysWalker - 2 wheels;Cane - single point;Grab bars - tub/shower      Prior Function Level of Independence: Independent               Hand Dominance   Dominant Hand: Right    Extremity/Trunk Assessment   Upper Extremity Assessment: Defer to OT evaluation  Lower Extremity Assessment: Generalized weakness      Cervical / Trunk Assessment: Other exceptions   Communication   Communication: No difficulties  Cognition Arousal/Alertness: Awake/alert Behavior During Therapy: WFL for tasks assessed/performed Overall Cognitive Status: Within Functional Limits for tasks assessed                      General Comments      Exercises        Assessment/Plan    PT Assessment Patient needs continued PT services  PT Diagnosis Difficulty walking;Acute pain   PT Problem List Decreased strength;Decreased range of motion;Decreased activity tolerance;Decreased balance;Decreased mobility;Decreased knowledge of use of DME;Decreased safety awareness;Decreased knowledge of precautions;Pain  PT Treatment Interventions DME instruction;Stair training;Gait training;Functional mobility training;Therapeutic activities;Therapeutic exercise;Neuromuscular re-education;Patient/family education   PT Goals (Current goals can be found in the Care Plan section) Acute Rehab PT Goals Patient Stated Goal: Home today or tomorrow PT Goal Formulation: With patient Time For Goal Achievement: 11/08/15 Potential to Achieve Goals: Good    Frequency Min 5X/week   Barriers to discharge        Co-evaluation               End of Session Equipment Utilized During Treatment: Back brace Activity Tolerance: Patient tolerated treatment well Patient left: in bed;with call bell/phone within reach Nurse Communication: Mobility status         Time: 0922-0955 PT Time Calculation (min) (ACUTE ONLY): 33 min   Charges:   PT Evaluation $PT Eval Moderate Complexity: 1 Procedure PT Treatments $Gait Training: 8-22 mins   PT G Codes:        Conni Slipper 11-07-2015, 11:48 AM  Conni Slipper, PT, DPT Acute Rehabilitation Services Pager: [phone]

## 2015-11-02 ENCOUNTER — Encounter: Payer: BC Managed Care – PPO | Admitting: Physical Therapy

## 2015-11-02 MED ORDER — OXYCODONE-ACETAMINOPHEN 10-325 MG PO TABS: 1.0000 | ORAL_TABLET | ORAL | Status: DC | PRN

## 2015-11-02 MED ORDER — DOCUSATE SODIUM 100 MG PO CAPS: 100.0000 mg | ORAL_CAPSULE | Freq: Two times a day (BID) | ORAL | Status: DC

## 2015-11-02 MED ORDER — CYCLOBENZAPRINE HCL 10 MG PO TABS: 10.0000 mg | ORAL_TABLET | Freq: Three times a day (TID) | ORAL | Status: DC | PRN

## 2015-11-02 NOTE — Care Management Note (Signed)
Case Management Note  Patient Details  Name: Angela Walters MRN: [id number] Date of Birth: 1950-09-22  Subjective/Objective:                    Action/Plan: Pt discharging home with her husband today. Orders for 3 in 1. CM met with the patient and she already has the 3 in 1 in her room. No further needs per CM.   Expected Discharge Date:                  Expected Discharge Plan:  Home/Self Care  In-House Referral:     Discharge planning Services  CM Consult  Post Acute Care Choice:  Durable Medical Equipment Choice offered to:     DME Arranged:  3-N-1 DME Agency:  [place]:    [place] Agency:     Status of Service:  Completed, signed off  If discussed at [place] of Stay Meetings, dates discussed:    Additional Comments:  [name], RN 11/02/2015, 11:38 AM

## 2015-11-02 NOTE — Progress Notes (Signed)
Physical Therapy Treatment Patient Details Name: Angela Walters MRN: [id number] DOB: [birth year] Today's Date: 11/02/2015    History of Present Illness Pt is a 65 y/o female who presents s/p L3-L5 PLIF on 10/31/15.    PT Comments    Patient is making good progress with PT.  From a mobility standpoint anticipate patient will be ready for DC home when medically ready.     Follow Up Recommendations  Outpatient PT;Supervision for mobility/OOB     Equipment Recommendations  3in1 (PT)    Recommendations for Other Services       Precautions / Restrictions Precautions Precautions: Fall;Back Precaution Booklet Issued: Yes (comment) Precaution Comments: Reviewed handout and pt was cued for precautions during functional mobility.  Required Braces or Orthoses: Spinal Brace Spinal Brace: Lumbar corset;Applied in sitting position Restrictions Weight Bearing Restrictions: No    Mobility  Bed Mobility Overal bed mobility: Needs Assistance Bed Mobility: Sit to Sidelying;Rolling Rolling: Modified independent (Device/Increase time)       Sit to sidelying: Supervision General bed mobility comments: cues for technique with carry over demonstrated  Transfers Overall transfer level: Needs assistance Equipment used: None Transfers: Sit to/from Stand Sit to Stand: Supervision         General transfer comment: supervision for safety; good technique and maintained precuations  Ambulation/Gait Ambulation/Gait assistance: Supervision Ambulation Distance (Feet): 400 Feet Assistive device: None Gait Pattern/deviations: Step-through pattern;Decreased stride length;Wide base of support Gait velocity: Decreased   General Gait Details: cues for posture; slow, steady gait    Stairs Stairs: Yes Stairs assistance: Supervision Stair Management: One rail Right;Sideways Number of Stairs: 10 General stair comments: cues for sequencing; good safety awareness; supervision for  safety  Wheelchair Mobility    Modified Rankin (Stroke Patients Only)       Balance Overall balance assessment: Needs assistance Sitting-balance support: Feet supported Sitting balance-Leahy Scale: Fair     Standing balance support: No upper extremity supported Standing balance-Leahy Scale: Fair                      Cognition Arousal/Alertness: Awake/alert Behavior During Therapy: WFL for tasks assessed/performed Overall Cognitive Status: Within Functional Limits for tasks assessed                      Exercises      General Comments General comments (skin integrity, edema, etc.): reviewed 3/3 back precuations      Pertinent Vitals/Pain Pain Assessment: Faces Faces Pain Scale: Hurts a little bit Pain Location: incision site Pain Descriptors / Indicators: Discomfort;Guarding Pain Intervention(s): Monitored during session;Repositioned    Home Living                      Prior Function            PT Goals (current goals can now be found in the care plan section) Acute Rehab PT Goals Patient Stated Goal: go home PT Goal Formulation: With patient Time For Goal Achievement: 11/08/15 Potential to Achieve Goals: Good Progress towards PT goals: Progressing toward goals    Frequency  Min 5X/week    PT Plan Current plan remains appropriate    Co-evaluation             End of Session Equipment Utilized During Treatment: Back brace;Gait belt Activity Tolerance: Patient tolerated treatment well Patient left: in bed;with call bell/phone within reach     Time: 0855-0920 PT Time Calculation (min) (ACUTE ONLY): 25 min  Charges:  $Gait Training: 8-22 mins $Therapeutic Activity: 8-22 mins                    G Codes:      Derek Mound, PTA Pager: [phone]   11/02/2015, 9:33 AM

## 2015-11-02 NOTE — Discharge Summary (Signed)
Physician Discharge Summary  Patient ID: Angela BradfordMary T Walters MRN: [id number] DOB/AGE: July 05, 1950 65 y.o.  Admit date: 10/31/2015 Discharge date: 11/02/2015  Admission Diagnoses:L3-4 and L4-5 spondylolisthesis, facet arthropathy, spinal stenosis, lumbago, lumbar radiculopathy, neurogenic claudication  Discharge Diagnoses: The same Active Problems:   Spondylolisthesis of lumbar region   Discharged Condition: good  Hospital Course: I performed an L3-4 and L4-5 decompression, instrumentation, and fusion on the patient on 10/31/2015. The surgery went well.  The patient's postoperative course was unremarkable. Her postoperative day #1 she requested discharge home. She was given written and oral discharge instructions. All her questions were answered.  Consults: Physical therapy Significant Diagnostic Studies: None Treatments: L3-4 and L4-5 decompression, instrumentation, and fusion. Discharge Exam: Blood pressure 119/46, pulse 75, temperature 99 F (37.2 C), temperature source Oral, resp. rate 18, height 5\' 4"  (1.626 m), weight 85.276 kg (188 lb), SpO2 96 %. The patient is alert and pleasant. She looks well. Her dressing is clean and dry. Her strength is normal in her lower extremities.  Disposition: Home  Discharge Instructions    Call MD for:  difficulty breathing, headache or visual disturbances    Complete by:  As directed      Call MD for:  extreme fatigue    Complete by:  As directed      Call MD for:  hives    Complete by:  As directed      Call MD for:  persistant dizziness or light-headedness    Complete by:  As directed      Call MD for:  persistant nausea and vomiting    Complete by:  As directed      Call MD for:  redness, tenderness, or signs of infection (pain, swelling, redness, odor or green/yellow discharge around incision site)    Complete by:  As directed      Call MD for:  severe uncontrolled pain    Complete by:  As directed      Call MD for:  temperature >100.4     Complete by:  As directed      Diet - low sodium heart healthy    Complete by:  As directed      Discharge instructions    Complete by:  As directed   Call [phone] for a followup appointment. Take a stool softener while you are using pain medications.     Driving Restrictions    Complete by:  As directed   Do not drive for 2 weeks.     Increase activity slowly    Complete by:  As directed      Lifting restrictions    Complete by:  As directed   Do not lift more than 5 pounds. No excessive bending or twisting.     May shower / Bathe    Complete by:  As directed   He may shower after the pain she is removed 3 days after surgery. Leave the incision alone.     Remove dressing in 24 hours    Complete by:  As directed             Medication List    TAKE these medications        cyclobenzaprine 10 MG tablet  Commonly known as:  FLEXERIL  Take 10 mg by mouth 3 (three) times daily as needed for muscle spasms.     cyclobenzaprine 10 MG tablet  Commonly known as:  FLEXERIL  Take 1 tablet (10 mg total) by mouth 3 (  three) times daily as needed for muscle spasms.     docusate sodium 100 MG capsule  Commonly known as:  COLACE  Take 1 capsule (100 mg total) by mouth 2 (two) times daily.     DYMISTA 137-50 MCG/ACT Susp  Generic drug:  Azelastine-Fluticasone  Place 1 spray into the nose daily.     estradiol 1 MG tablet  Commonly known as:  ESTRACE  Take 1 mg by mouth daily.     oxyCODONE-acetaminophen 10-325 MG tablet  Commonly known as:  PERCOCET  Take 1 tablet by mouth every 4 (four) hours as needed for pain.     traMADol 50 MG tablet  Commonly known as:  ULTRAM  Take by mouth every 6 (six) hours as needed.         SignedCristi Loron 11/02/2015, 7:31 AM

## 2015-11-02 NOTE — Progress Notes (Signed)
Discharge instruction reviewed with patient. All questions answered at this time. RX given. To patient. Awaiting for transportation from spouse. He will be here at 1400PM. BSC at bedside for home use.    Sim BoastHavy, RN

## 2015-11-09 ENCOUNTER — Encounter: Payer: BC Managed Care – PPO | Admitting: Physical Therapy

## 2015-11-11 ENCOUNTER — Encounter: Payer: BC Managed Care – PPO | Admitting: Physical Therapy

## 2015-11-18 ENCOUNTER — Encounter: Payer: BC Managed Care – PPO | Admitting: Physical Therapy

## 2015-12-23 ENCOUNTER — Other Ambulatory Visit: Payer: BC Managed Care – PPO

## 2015-12-23 ENCOUNTER — Ambulatory Visit: Payer: BC Managed Care – PPO | Attending: Obstetrics and Gynecology

## 2015-12-28 ENCOUNTER — Encounter: Payer: Self-pay | Admitting: Physical Therapy

## 2015-12-28 ENCOUNTER — Ambulatory Visit: Payer: BC Managed Care – PPO | Attending: Neurosurgery | Admitting: Physical Therapy

## 2015-12-28 DIAGNOSIS — R262 Difficulty in walking, not elsewhere classified: Secondary | ICD-10-CM | POA: Diagnosis present

## 2015-12-28 DIAGNOSIS — M5442 Lumbago with sciatica, left side: Secondary | ICD-10-CM | POA: Diagnosis present

## 2015-12-28 DIAGNOSIS — M6281 Muscle weakness (generalized): Secondary | ICD-10-CM | POA: Insufficient documentation

## 2015-12-28 DIAGNOSIS — M5441 Lumbago with sciatica, right side: Secondary | ICD-10-CM | POA: Diagnosis present

## 2015-12-29 NOTE — Therapy (Signed)
[facility] [address] [place], [place], [address] Phone: [phone]   Fax:  [phone]  Physical Therapy Evaluation  Patient Details  Name: Angela BradfordMary T Walters MRN: [id number] Date of Birth: [birth year] Referring Provider: Karn CassisBotero, Ernesto, M. MD  Encounter Date: 12/28/2015      PT End of Session - 12/28/15 1822    Visit Number 1   Number of Visits 8   Date for PT Re-Evaluation 01/25/16   PT Start Time 1800   PT Stop Time 1900   PT Time Calculation (min) 60 min   Activity Tolerance Patient tolerated treatment well   Behavior During Therapy [facility] for tasks assessed/performed      Past Medical History:  Diagnosis Date  . Arthritis   . Frequent urination   . Frequent urination at night     Past Surgical History:  Procedure Laterality Date  . Buninectomy Right 2005  . COLONOSCOPY    . Conezation    . HYSTERECTOMY ABDOMINAL WITH SALPINGECTOMY  2000  . removal milk duct Left 1985    There were no vitals filed for this visit.       Subjective Assessment - 12/28/15 1826    Subjective Patient reports she is doing well since surgery. She is walking at least a mile infrequently. She is walking up to 10000 steps a day (less when at school). First thing in the morning she feels more pain and is stiff.    Pertinent History Expereicnes back pain for 20 years; Epidural on 09/06/15 with reported alleviation of severe pain in back. She underwent back surgery 10/10/2015.   Limitations Lifting;Walking;Sitting;House hold activities   Patient Stated Goals to improve strength and be independent with exercises, get back to water aerobics   Currently in Pain? Yes   Pain Score 2   no pain up to 4-5/10   Pain Location Back   Pain Orientation Lower   Pain Descriptors / Indicators Discomfort   Pain Type Acute pain;Surgical pain   Pain Onset More than a month ago   Pain Frequency Intermittent   Aggravating Factors  sitting,  bending    Effect of Pain on Daily Activities limited by post surgical pain, limitations            [facility] Assessment - 12/28/15 1805      Assessment   Medical Diagnosis Spondylolisthesis, lumbar region (M43.16), surgery lumbar fusion, laminotomy, laminectomy, bone grafts 10/10/2015   Referring Provider Karn CassisBotero, Ernesto, M. MD   Onset Date/Surgical Date 10/10/15   Hand Dominance Right   Next MD Visit unknown   Prior Therapy None     Precautions   Precautions Back   Precaution Booklet Issued No   Required Braces or Orthoses Spinal Brace  discontinue at 4-6 weeks, wears as needed for work      Restrictions   Weight Bearing Restrictions No     Balance Screen   Has the patient fallen in the past 6 months Yes   How many times? 1  at beach after surgery, was wearing  brace   Has the patient had a decrease in activity level because of a fear of falling?  No   Is the patient reluctant to leave their home because of a fear of falling?  No     Home Nurse, mental healthnvironment   Living Environment Private residence   Living Arrangements Spouse/significant other   Available Help at Discharge Family   Type of Home House  Home Access Stairs to enter   Entrance Stairs-Number of Steps 7   Entrance Stairs-Rails Can reach both   Home Layout Two level   Alternate Level Stairs-Number of Steps 13   Alternate Level Stairs-Rails Can reach both   Home Equipment Walker - 2 wheels;Cane - single point;Shower seat;Hand held shower head     Prior Function   Level of Independence Independent   Vocation Full time employment  Scientist, research (physical sciences) Requirements Walking, sitting, reaching up    Leisure reading, walking, playing with grandson     Objective; Posture: WFL sitting, standing Mobility: Transfers on/off treatment table with moderate difficulty with pain in lower back; required verbal cuing and demonstration to perform log rolling Strength:  Moderate decreased core control, LE strength as  demonstrated during exercises and transfers (limited assessment secondary to recent surgery/limitations)  Treatment: Therapeutic exercise; patient performed exercises with guidance, verbal and tactile cues and demonstration of PT: Sitting: Hip adduction with ball and glute sets x 10 Hip abduction in sitting x 15 Side lying clam x 10 each LE Core control TrA contraction with marching in hook lying  Patient response to treatment: Demonstrated good technique with exercises with repetition and demonstration/verbal cuing        PT Education - 12/28/15 1815    Education provided Yes   Education Details HEP: hip adduction with ball/glute sets, hip abduction with resistive band, supine TrA contraction stabilization marches   Person(s) Educated Patient   Methods Explanation;Demonstration   Comprehension Verbalized understanding;Returned demonstration             PT Long Term Goals - 12/28/15 1906      PT LONG TERM GOAL #1   Title Patient will demonstrate a MODI of <20% by 01/25/16 to demonstrate significant improvement in lumbar function and ability to perform daily tasks with mild difficulty.   Baseline MODI 50% (moderate self perceived disabiltiy)   Status New     PT LONG TERM GOAL #2   Title Patient will be independent with exercise performance and progression focused on improving lumbar stabilization by 01/25/16 to continue benefit from therapy after discharge.    Baseline limited knowledge of appopriate exercises and progression without assistance   Status New               Plan - 12/28/15 1821    Clinical Impression Statement Patient is a 25 right hand dominant female who  presents s/p lumbar surgery with decreased strength and abiltiy to perform daily tasks without difficulty. She has limited pain control strategies, progression of exercise in order to improve function.    Rehab Potential Good   Clinical Impairments Affecting Rehab Potential (+) Family support,  acute condition surgery (-) age, chronic pain/condition prior to surgery   PT Frequency 2x / week   PT Duration 4 weeks   PT Treatment/Interventions Patient/family education;Cryotherapy;Electrical Stimulation;Moist Heat;Neuromuscular re-education;Therapeutic exercise;Manual techniques   PT Next Visit Plan pain control, progress exercises s/p back surgery for core control   PT Home Exercise Plan Ball squeeze/glute squeeze; seated clamshells in sitting, supine TrA contraction, side lying clam   Consulted and Agree with Plan of Care Patient      Patient will benefit from skilled therapeutic intervention in order to improve the following deficits and impairments:  Difficulty walking, Decreased endurance, Increased muscle spasms, Decreased activity tolerance, Pain, Decreased strength, Impaired perceived functional ability, Decreased knowledge of precautions  Visit Diagnosis: Difficulty in walking, not elsewhere classified - Plan: PT plan  of care cert/re-cert  Muscle weakness (generalized) - Plan: PT plan of care cert/re-cert     Problem List Patient Active Problem List   Diagnosis Date Noted  . Spondylolisthesis of lumbar region 10/31/2015    Beacher May PT 12/29/2015, 11:34 PM  [facility] [address] [place], [address] Phone: [phone]   Fax:  [phone]  Name: MARITHZA Walters MRN: [id number] Date of Birth: 28-Apr-1950

## 2016-01-02 ENCOUNTER — Ambulatory Visit: Payer: BC Managed Care – PPO | Admitting: Physical Therapy

## 2016-01-04 ENCOUNTER — Encounter: Payer: Self-pay | Admitting: Physical Therapy

## 2016-01-04 ENCOUNTER — Ambulatory Visit: Payer: BC Managed Care – PPO | Admitting: Physical Therapy

## 2016-01-04 DIAGNOSIS — M6281 Muscle weakness (generalized): Secondary | ICD-10-CM

## 2016-01-04 DIAGNOSIS — R262 Difficulty in walking, not elsewhere classified: Secondary | ICD-10-CM | POA: Diagnosis not present

## 2016-01-05 NOTE — Therapy (Signed)
[facility] PHYSICAL AND SPORTS MEDICINE [address] [place], [place], [address] Phone: [phone]   Fax:  [phone]  Physical Therapy Treatment  Patient Details  Name: Angela BradfordMary T Walters MRN: [id number] Date of Birth: 01-29-1951 Referring Provider: Karn CassisBotero, Ernesto, M. MD  Encounter Date: 01/04/2016      PT End of Session - 01/04/16 1758    Visit Number 2   Number of Visits 8   Date for PT Re-Evaluation 01/25/16   PT Start Time 1753   PT Stop Time 1828   PT Time Calculation (min) 35 min   Activity Tolerance Patient tolerated treatment well   Behavior During Therapy [facility] for tasks assessed/performed      Past Medical History:  Diagnosis Date  . Arthritis   . Frequent urination   . Frequent urination at night     Past Surgical History:  Procedure Laterality Date  . Buninectomy Right 2005  . COLONOSCOPY    . Conezation    . HYSTERECTOMY ABDOMINAL WITH SALPINGECTOMY  2000  . removal milk duct Left 1985    There were no vitals filed for this visit.      Subjective Assessment - 01/04/16 1754    Subjective Patient reports she is still very sore at end of the day. She is stiff in the morning.   Limitations Lifting;Walking;Sitting;House hold activities   Patient Stated Goals to improve strength and be independent with exercises, get back to water aerobics   Currently in Pain? No/denies  still worse with lying down     Objective:  Observation: transfers on/off treatment table with good technique for log rolling, still slow and cautious with pain noted   Treatment: Therapeutic exercise: patient performed exercises with guidance, verbal and tactile cues and demonstration of PT: Supine lying:  TrA contraction marches x 10 Side lying clam x 10 Sitting; Hip adduction with glute sets x 10 Hip abduction with resistive band x 10 Standing hip abduction/extension alternating 3 x 5 sets Partial lunges with leg back x 5 each LE Partial  squats off elevated table 23", with ball between knees x 10 reps Step ups forward x 10 each LE, side step ups x 10 each LE  Patient response to treatment; improved core control noted with all exercises with minimal cuing to perform with good alignment and technique        PT Education - 01/04/16 1756    Education provided Yes   Education Details HEP: re assessed stabilization with marches and additional exercises for strength/stabilization in standing   Person(s) Educated Patient   Methods Explanation;Demonstration;Verbal cues   Comprehension Verbalized understanding;Returned demonstration;Verbal cues required             PT Long Term Goals - 12/28/15 1906      PT LONG TERM GOAL #1   Title Patient will demonstrate a MODI of <20% by 01/25/16 to demonstrate significant improvement in lumbar function and ability to perform daily tasks with mild difficulty.   Baseline MODI 50% (moderate self perceived disabiltiy)   Status New     PT LONG TERM GOAL #2   Title Patient will be independent with exercise performance and progression focused on improving lumbar stabilization by 01/25/16 to continue benefit from therapy after discharge.    Baseline limited knowledge of appopriate exercises and progression without assistance   Status New               Plan - 01/04/16 1830    Clinical  Impression Statement Patient is progressing well with good technique and posture with all exercises with guidance, verbal cuing and demonstration. She conitnues with weakness and pain in back s/p surgery and will require additional physical therapy intervention to achieve goals and maximal function for performing daily tasks.    Rehab Potential Good   PT Frequency 2x / week   PT Duration 4 weeks   PT Treatment/Interventions Patient/family education;Cryotherapy;Electrical Stimulation;Moist Heat;Neuromuscular re-education;Therapeutic exercise;Manual techniques   PT Next Visit Plan pain control,  progress exercises s/p back surgery for core control   PT Home Exercise Plan Ball squeeze/glute squeeze; seated clamshells in sitting, supine TrA contraction, side lying clam      Patient will benefit from skilled therapeutic intervention in order to improve the following deficits and impairments:  Difficulty walking, Decreased endurance, Increased muscle spasms, Decreased activity tolerance, Pain, Decreased strength, Impaired perceived functional ability, Decreased knowledge of precautions  Visit Diagnosis: Difficulty in walking, not elsewhere classified  Muscle weakness (generalized)     Problem List Patient Active Problem List   Diagnosis Date Noted  . Spondylolisthesis of lumbar region 10/31/2015    Beacher May PT 01/05/2016, 2:32 PM  [facility] PHYSICAL AND SPORTS MEDICINE [address], [place], [address] Phone: [phone]   Fax:  [phone]  Name: Angela Walters MRN: [id number] Date of Birth: [birth year]

## 2016-01-11 ENCOUNTER — Encounter: Payer: Self-pay | Admitting: Physical Therapy

## 2016-01-11 ENCOUNTER — Ambulatory Visit: Payer: BC Managed Care – PPO | Admitting: Physical Therapy

## 2016-01-11 DIAGNOSIS — M6281 Muscle weakness (generalized): Secondary | ICD-10-CM

## 2016-01-11 DIAGNOSIS — M5442 Lumbago with sciatica, left side: Secondary | ICD-10-CM

## 2016-01-11 DIAGNOSIS — M5441 Lumbago with sciatica, right side: Secondary | ICD-10-CM

## 2016-01-11 DIAGNOSIS — R262 Difficulty in walking, not elsewhere classified: Secondary | ICD-10-CM

## 2016-01-12 NOTE — Therapy (Signed)
[facility] PHYSICAL AND SPORTS MEDICINE [address] [place], [address] Phone: [phone]   Fax:  [phone]  Physical Therapy Treatment  Patient Details  Name: Angela Walters MRN: [id number] Date of Birth: [birth year] Referring Provider: Karn Cassis MD  Encounter Date: 01/11/2016      PT End of Session - 01/11/16 1806    Visit Number 3   Number of Visits 8   Date for PT Re-Evaluation 01/25/16   PT Start Time 1722   PT Stop Time 1800   PT Time Calculation (min) 38 min   Activity Tolerance Patient tolerated treatment well   Behavior During Therapy [facility] for tasks assessed/performed      Past Medical History:  Diagnosis Date  . Arthritis   . Frequent urination   . Frequent urination at night     Past Surgical History:  Procedure Laterality Date  . Buninectomy Right 2005  . COLONOSCOPY    . Conezation    . HYSTERECTOMY ABDOMINAL WITH SALPINGECTOMY  2000  . removal milk duct Left 1985    There were no vitals filed for this visit.      Subjective Assessment - 01/11/16 1727    Subjective Patient reports she continues with stiffness in her trunk. She reports increased stiffness right side of back today   Limitations Lifting;Walking;Sitting;House hold activities   Patient Stated Goals to improve strength and be independent with exercises, get back to water aerobics   Currently in Pain? No/denies        Objective:  Observation: transfers on/off treatment table with good technique for log rolling, still slow and cautious with pain noted,improved as compared to previous session Palpation: + spasms along lower lumbar spine paraspinal muscles   Treatment: Manual therapy: Soft tissue mobilization lumbar spine with patient side lying; lumbar paraspinal muscles right lower back superficial techniques to reduce spasms, pain Therapeutic exercise: patient performed exercises with guidance, verbal and tactile cues and  demonstration of PT: Supine lying:  TrA contraction marches x 10, dead bug July 03, 2022 with opposite UE raise x 10 Side lying clam x 10 Sitting; Hip adduction with glute sets x 10 Hip abduction with resistive band x 10 Standing hip abduction/extension alternating 3 x 5 sets Partial squats off elevated table 23", with ball between knees x 10 reps Step ups forward x 10 each LE, side step ups x 10 each LE Side step along balance beam x 2 min.  Patient response to treatment; improved posture and technique with minimal VC; mild fatigue noted        PT Education - 01/11/16 1830    Education provided Yes   Education Details HEP: instructed in clamshells, standing side stepping and dynamic balancing   Person(s) Educated Patient   Methods Explanation;Demonstration;Verbal cues   Comprehension Verbalized understanding;Returned demonstration;Verbal cues required             PT Long Term Goals - 12/28/15 1906      PT LONG TERM GOAL #1   Title Patient will demonstrate a MODI of <20% by 01/25/16 to demonstrate significant improvement in lumbar function and ability to perform daily tasks with mild difficulty.   Baseline MODI 50% (moderate self perceived disabiltiy)   Status New     PT LONG TERM GOAL #2   Title Patient will be independent with exercise performance and progression focused on improving lumbar stabilization by 01/25/16 to continue benefit from therapy after discharge.    Baseline limited knowledge of  appopriate exercises and progression without assistance   Status New               Plan - 01/11/16 1800    Clinical Impression Statement Patient is progressing towards goals with good carry over between sessions. She continues with decreased core control and strength and will benifit from additional physical therapy intervention to achieve maximal benefit for functional activities without difficulty.   Rehab Potential Good   PT Duration 4 weeks   PT Treatment/Interventions  Patient/family education;Cryotherapy;Electrical Stimulation;Moist Heat;Neuromuscular re-education;Therapeutic exercise;Manual techniques   PT Next Visit Plan pain control, progress exercises s/p back surgery for core control   PT Home Exercise Plan Ball squeeze/glute squeeze; seated clamshells in sitting, supine TrA contraction, side lying clam      Patient will benefit from skilled therapeutic intervention in order to improve the following deficits and impairments:  Difficulty walking, Decreased endurance, Increased muscle spasms, Decreased activity tolerance, Pain, Decreased strength, Impaired perceived functional ability, Decreased knowledge of precautions  Visit Diagnosis: Difficulty in walking, not elsewhere classified  Muscle weakness (generalized)  Bilateral low back pain with sciatica, sciatica laterality unspecified     Problem List Patient Active Problem List   Diagnosis Date Noted  . Spondylolisthesis of lumbar region 10/31/2015    Beacher MayBrooks, Mosie Angus PT 01/12/2016, 9:38 PM  [facility] [address] [place], [place], [address] Phone: [phone]   Fax:  [phone]  Name: Angela Walters MRN: [id number] Date of Birth: November 28, 1950

## 2016-01-13 ENCOUNTER — Encounter: Payer: BC Managed Care – PPO | Admitting: Physical Therapy

## 2016-01-17 ENCOUNTER — Ambulatory Visit: Payer: BC Managed Care – PPO | Admitting: Physical Therapy

## 2016-01-17 ENCOUNTER — Encounter: Payer: Self-pay | Admitting: Physical Therapy

## 2016-01-17 DIAGNOSIS — M5441 Lumbago with sciatica, right side: Secondary | ICD-10-CM

## 2016-01-17 DIAGNOSIS — R262 Difficulty in walking, not elsewhere classified: Secondary | ICD-10-CM

## 2016-01-17 DIAGNOSIS — M5442 Lumbago with sciatica, left side: Secondary | ICD-10-CM

## 2016-01-17 DIAGNOSIS — M6281 Muscle weakness (generalized): Secondary | ICD-10-CM

## 2016-01-18 NOTE — Therapy (Signed)
[facility] PHYSICAL [facility] SPORTS MEDICINE [address] [place], [place], [address] Phone: [phone]   Fax:  [phone]  Physical Therapy Treatment  Patient Details  Name: Angela BradfordMary T Walters MRN: [id number] Date of Birth: [birth year] Referring Provider: Karn CassisBotero, Ernesto, M. MD  Encounter Date: 01/17/2016      PT End of Session - 01/17/16 1935    Visit Number 4   Number of Visits 8   Date for PT Re-Evaluation 01/25/16   PT Start Time 1900   PT Stop Time 1935   PT Time Calculation (min) 35 min   Activity Tolerance Patient tolerated treatment well   Behavior During Therapy [facility] for tasks assessed/performed      Past Medical History:  Diagnosis Date  . Arthritis   . Frequent urination   . Frequent urination at night     Past Surgical History:  Procedure Laterality Date  . Buninectomy Right 2005  . COLONOSCOPY    . Conezation    . HYSTERECTOMY ABDOMINAL WITH SALPINGECTOMY  2000  . removal milk duct Left 1985    There were no vitals filed for this visit.      Subjective Assessment - 01/17/16 1904    Subjective Patient reports she continues with stiffness in her trunk and pain in left lower back. She has not contacted MD yet to let him know about increased lower back pain.    Limitations Lifting;Walking;Sitting;House hold activities   Patient Stated Goals to improve strength and be independent with exercises, get back to water aerobics   Currently in Pain? No/denies      Objective:  Observation: transfers on/off treatment table with good technique for log rolling, still slow and cautious with pain noted,improved as compared to previous session Palpation: + spasms along lower lumbar spine paraspinal muscles with left sided spasm ~ quarter sized diameter L3 region   Treatment: Manual therapy: Soft tissue mobilization lumbar spine with patient side lying right; lumbar paraspinal muscles lower back superficial techniques to reduce  spasms, pain  Therapeutic exercise: patient performed exercises with guidance, verbal and tactile cues and demonstration of PT: Side lying clam x 10 Sitting; Hip adduction with glute sets x 10 Hip abduction with resistive band x 10 Side step along balance beam x 2 min.  Patient response to treatment; decreased pain and spasms to mild left lower back. She demonstrated good technique with exercises with minimal VC, limited exercises due to left lower back symptoms/pain         PT Education - 01/17/16 1930    Education provided Yes   Education Details HEP: clamshells, seated hip adduction with ball and glute sets, hip abduciton with resistive band   Person(s) Educated Patient   Methods Explanation;Demonstration;Verbal cues   Comprehension Verbalized understanding;Returned demonstration;Verbal cues required             PT Long Term Goals - 12/28/15 1906      PT LONG TERM GOAL #1   Title Patient will demonstrate a MODI of <20% by 01/25/16 to demonstrate significant improvement in lumbar function and ability to perform daily tasks with mild difficulty.   Baseline MODI 50% (moderate self perceived disabiltiy)   Status New     PT LONG TERM GOAL #2   Title Patient will be independent with exercise performance and progression focused on improving lumbar stabilization by 01/25/16 to continue benefit from therapy after discharge.    Baseline limited knowledge of appopriate exercises and progression without assistance  Status New               Plan - 01/17/16 1945    Clinical Impression Statement limited exercises tolerated due to increased pain/spasms along left side of lower back. Patient demonstrates good understanding of home program for pain cotnrol, exercises. She will monitor pain in left lower back and contact MD if symptoms worsen or don't improve.    Rehab Potential Good   PT Frequency 2x / week   PT Duration 4 weeks   PT Treatment/Interventions Patient/family  education;Cryotherapy;Electrical Stimulation;Moist Heat;Neuromuscular re-education;Therapeutic exercise;Manual techniques   PT Next Visit Plan pain control, progress exercises s/p back surgery for core control, manual therapy   PT Home Exercise Plan Ball squeeze/glute squeeze; seated clamshells in sitting, supine TrA contraction, side lying clam      Patient will benefit from skilled therapeutic intervention in order to improve the following deficits and impairments:  Difficulty walking, Decreased endurance, Increased muscle spasms, Decreased activity tolerance, Pain, Decreased strength, Impaired perceived functional ability, Decreased knowledge of precautions  Visit Diagnosis: Difficulty in walking, not elsewhere classified  Muscle weakness (generalized)  Bilateral low back pain with sciatica, sciatica laterality unspecified     Problem List Patient Active Problem List   Diagnosis Date Noted  . Spondylolisthesis of lumbar region 10/31/2015    Beacher May PT 01/18/2016, 10:50 PM  [facility] PHYSICAL AND SPORTS MEDICINE [address] [place], [address] Phone: [phone]   Fax:  [phone]  Name: Angela Walters MRN: [id number] Date of Birth: [birth year]

## 2016-01-24 ENCOUNTER — Ambulatory Visit: Payer: BC Managed Care – PPO | Attending: Neurosurgery | Admitting: Physical Therapy

## 2016-01-24 ENCOUNTER — Encounter: Payer: Self-pay | Admitting: Physical Therapy

## 2016-01-24 DIAGNOSIS — R262 Difficulty in walking, not elsewhere classified: Secondary | ICD-10-CM | POA: Diagnosis present

## 2016-01-24 DIAGNOSIS — M6281 Muscle weakness (generalized): Secondary | ICD-10-CM | POA: Insufficient documentation

## 2016-01-24 NOTE — Therapy (Signed)
[facility] PHYSICAL [facility] SPORTS MEDICINE [address] [place], [place], [address] Phone: [phone]   Fax:  [phone]  Physical Therapy Treatment/Discharge Summary  Patient Details  Name: Angela BradfordMary T Walters MRN: [id number] Date of Birth: October 06, 1950 Referring Provider: Hilda LiasBotero, Ernesto, Judie PetitM. MD  Encounter Date: 01/24/2016   Patient began physical therapy 12/28/2015 and attended 5 sessions through 01/24/2016 with goals achieved for independent home program. Patient should continue to improve strength and endurance with gradual return to water aerobics and normal activity as she continues to heal from recent surgery 10/10/2015       PT End of Session - 01/24/16 1808    Visit Number 5   Number of Visits 8   Date for PT Re-Evaluation 01/25/16   PT Start Time 1805   PT Stop Time 1835   PT Time Calculation (min) 30 min   Activity Tolerance Patient tolerated treatment well   Behavior During Therapy [facility] for tasks assessed/performed      Past Medical History:  Diagnosis Date  . Arthritis   . Frequent urination   . Frequent urination at night     Past Surgical History:  Procedure Laterality Date  . Buninectomy Right 2005  . COLONOSCOPY    . Conezation    . HYSTERECTOMY ABDOMINAL WITH SALPINGECTOMY  2000  . removal milk duct Left 1985    There were no vitals filed for this visit.      Subjective Assessment - 01/24/16 1805    Subjective improving with less pan and now described as discomfort more than pain. Patient agrees to discharge PT and continue with independent home program and will contact MD or PT if further needs arise.   Limitations Lifting;Walking;Sitting;House hold activities   Patient Stated Goals to improve strength and be independent with exercises, get back to water aerobics   Currently in Pain? No/denies      Objective; Initial evaluation (12/28/15):  Posture: WFL sitting, standing Mobility: Transfers on/off treatment table  with moderate difficulty with pain in lower back; required verbal cuing and demonstration to perform log rolling Strength:  Moderate decreased core control, LE strength as demonstrated during exercises and transfers (limited assessment secondary to recent surgery/limitations)  Current: 01/24/16 Posture: WFL  Transfers on/off treatment table with mild difficulty and improved motor control Strength: improved core control, LE strength hip abduction, extension and ER 4-/5, knee extension and flexion 4/5  Outcome measure: MODI 18% significant improvement noted (mild self perceived disability: able to self manage symptoms, independent with home program) Initially 12/28/15 was 50% (moderate self perceived disability)  Treatment:  Therapeutic exercise: patient performed exercises with guidance, verbal and tactile cues and demonstration of PT: Side lying clam x 10 Supine;  Stabilization marching x 10 Dead bug with hook lying opposite arm/LE march x 10 reps Sitting; Hip adduction with glute sets x 10 Hip abduction with resistive band x 10 Side step with resistive band around thighs x 15 Standing hip abduction/extension with resistive band /core control 3 x 3 reps  Patient response to treatment; patient demonstrated good understanding of home program for stabilization of core in sitting, supine and standing.         PT Education - 01/24/16 1806    Education provided Yes   Education Details HEP: re assessed home program for exercises for stabilization.    Person(s) Educated Patient   Methods Explanation;Demonstration;Verbal cues   Comprehension Verbalized understanding;Verbal cues required;Returned demonstration  PT Long Term Goals - 01/24/16 1900      PT LONG TERM GOAL #1   Title Patient will demonstrate a MODI of <20% by 01/25/16 to demonstrate significant improvement in lumbar function and ability to perform daily tasks with mild difficulty.   Baseline MODI 50%  (moderate self perceived disabiltiy) current 01/24/16: 18%   Status Achieved     PT LONG TERM GOAL #2   Title Patient will be independent with exercise performance and progression focused on improving lumbar stabilization by 01/25/16 to continue benefit from therapy after discharge.    Baseline limited knowledge of appopriate exercises and progression without assistance; currently independent with home program 01/24/16   Status Achieved               Plan - 01/24/16 1845    Clinical Impression Statement Patient has achieved goals and is ready for discharge from physical therapy.Patient should continue to improve strength and endurance with gradual return to water aerobics and normal activity as she continues to heal from recent surgery 10/10/2015    Rehab Potential Good   PT Frequency 2x / week   PT Duration 4 weeks   PT Treatment/Interventions Patient/family education;Cryotherapy;Electrical Stimulation;Moist Heat;Neuromuscular re-education;Therapeutic exercise;Manual techniques   PT Next Visit Plan pain control, progress exercises s/p back surgery for core control, manual therapy   PT Home Exercise Plan Ball squeeze/glute squeeze; seated clamshells in sitting, supine TrA contraction, side lying clam      Patient will benefit from skilled therapeutic intervention in order to improve the following deficits and impairments:  Difficulty walking, Decreased endurance, Increased muscle spasms, Decreased activity tolerance, Pain, Decreased strength, Impaired perceived functional ability, Decreased knowledge of precautions  Visit Diagnosis: Difficulty in walking, not elsewhere classified  Muscle weakness (generalized)     Problem List Patient Active Problem List   Diagnosis Date Noted  . Spondylolisthesis of lumbar region 10/31/2015    Beacher May PT 01/24/2016, 11:05 PM  [facility] PHYSICAL AND SPORTS MEDICINE [address] [place], [address] Phone: [phone]   Fax:  [phone]  Name: Angela Walters CHAMBLESS MRN: [id number] Date of Birth: [birth year]

## 2016-01-31 ENCOUNTER — Ambulatory Visit: Payer: BC Managed Care – PPO | Admitting: Physical Therapy

## 2016-02-06 ENCOUNTER — Encounter: Payer: BC Managed Care – PPO | Admitting: Physical Therapy

## 2016-04-05 ENCOUNTER — Other Ambulatory Visit: Payer: Self-pay | Admitting: Obstetrics and Gynecology

## 2016-04-05 DIAGNOSIS — N6489 Other specified disorders of breast: Secondary | ICD-10-CM

## 2016-10-19 ENCOUNTER — Other Ambulatory Visit: Payer: Self-pay | Admitting: Obstetrics and Gynecology

## 2016-10-19 DIAGNOSIS — N6489 Other specified disorders of breast: Secondary | ICD-10-CM

## 2016-11-23 ENCOUNTER — Ambulatory Visit
Admission: RE | Admit: 2016-11-23 | Discharge: 2016-11-23 | Disposition: A | Discharge status: Home / Self Care | Payer: Medicare Other | Source: Ambulatory Visit | Attending: Obstetrics and Gynecology | Admitting: Obstetrics and Gynecology

## 2016-11-23 DIAGNOSIS — N6489 Other specified disorders of breast: Secondary | ICD-10-CM | POA: Insufficient documentation

## 2016-11-23 DIAGNOSIS — R921 Mammographic calcification found on diagnostic imaging of breast: Secondary | ICD-10-CM | POA: Diagnosis not present

## 2017-01-21 ENCOUNTER — Ambulatory Visit (INDEPENDENT_AMBULATORY_CARE_PROVIDER_SITE_OTHER): Payer: Medicare Other | Admitting: Podiatry

## 2017-01-21 ENCOUNTER — Encounter: Payer: Self-pay | Admitting: Podiatry

## 2017-01-21 ENCOUNTER — Ambulatory Visit (INDEPENDENT_AMBULATORY_CARE_PROVIDER_SITE_OTHER): Payer: Medicare Other

## 2017-01-21 VITALS — BP 128/65 | HR 59 | Resp 16

## 2017-01-21 DIAGNOSIS — M722 Plantar fascial fibromatosis: Secondary | ICD-10-CM

## 2017-01-21 DIAGNOSIS — L603 Nail dystrophy: Secondary | ICD-10-CM

## 2017-01-21 DIAGNOSIS — M778 Other enthesopathies, not elsewhere classified: Secondary | ICD-10-CM

## 2017-01-21 DIAGNOSIS — M7751 Other enthesopathy of right foot: Secondary | ICD-10-CM

## 2017-01-21 DIAGNOSIS — M779 Enthesopathy, unspecified: Secondary | ICD-10-CM

## 2017-01-21 MED ORDER — METHYLPREDNISOLONE 4 MG PO TBPK: ORAL_TABLET | ORAL | 0 refills | Status: DC

## 2017-01-21 MED ORDER — MELOXICAM 15 MG PO TABS: 15.0000 mg | ORAL_TABLET | Freq: Every day | ORAL | 3 refills | Status: DC

## 2017-01-21 NOTE — Patient Instructions (Signed)

## 2017-01-21 NOTE — Progress Notes (Signed)
  Subjective:  Patient ID: Angela Walters, female    DOB: Oct 19, 1950,  MRN: [id number] HPI Chief Complaint  Patient presents with  . Foot Pain    Medial foot right - aching x 5-6 weeks, no injury, more activity  . Foot Pain    Plantar heel left - aching for few weeks, AM pain, walking more since retired, tried stretching   . Nail Problem    Patient states Dr. Al Corpus treated her nail fungus years ago and Rx'd a clear nail polish type med but it did not help    66 y.o. female presents with the above complaint.  Past Medical History:  Diagnosis Date  . Arthritis   . Frequent urination   . Frequent urination at night    Past Surgical History:  Procedure Laterality Date  . Buninectomy Right 2005  . COLONOSCOPY    . Conezation    . HYSTERECTOMY ABDOMINAL WITH SALPINGECTOMY  2000  . removal milk duct Left 1985    Current Outpatient Prescriptions:  .  Azelastine-Fluticasone (DYMISTA) 137-50 MCG/ACT SUSP, Place 1 spray into the nose daily., Disp: , Rfl:  .  estradiol (ESTRACE) 1 MG tablet, Take 1 mg by mouth daily., Disp: , Rfl:   No Known Allergies Review of Systems  Constitutional: Positive for unexpected weight change.  HENT: Positive for hearing loss and tinnitus.   Musculoskeletal: Positive for back pain and gait problem.  All other systems reviewed and are negative.  Objective:   Vitals:   01/21/17 1132  BP: 128/65  Pulse: (!) 59  Resp: 16   General AA&O x3. Normal mood and affect.  Vascular Dorsalis pedis and posterior tibial pulses 2/4 bilat. Brisk capillary refill to all digits. Pedal hair present.  Neurologic Epicritic sensation grossly intact.  Dermatologic No open lesions. Interspaces clear of maceration. Nails well groomed and normal in appearance.Thick raised dystrophic toenails possibly mycotic.   Orthopedic: MMT 5/5 in dorsiflexion, plantarflexion, inversion, and eversion. Normal joint ROM without pain or crepitus.Right foot demonstrates mild pes planus  soft tissue increase in density on radiographs overlying the tibialis anterior tendon and posterior tibial tendon right foot demonstrating tenderness on palpation. She also has pain on palpation medial calcaneal tubercle of the left heel.    Radiographs: Taken and reviewed. No acute fractures or dislocations. No other osseous abnormalities. Soft tissue increase in density plantar medial aspect dorsal medial aspect of the right foot. No fractures identified right foot. History of previous bunion surgery is present. 2 K wires are retained in the first metatarsal with a mild hallux varus. Left foot demonstrates soft tissue increase in density at the plantar fascia insertion site of the left heel. No fractures are identified. Small plantar distally oriented calcaneal spurs noted.  Assessment & Plan:  Patient was evaluated and treated and all questions answered.  Assessment: Plantar fasciitis right. Tibialis anterior tendinitis right. Nail dystrophy lateral.  Samples of the nail and skin were taken today for pathologic evaluation. Injected the left heel today with a local anesthetic dispensed a plantar fascia brace and a night splint. Start her Medrol Dosepak to be followed by meloxicam. I will follow-up with her in 1 month.

## 2017-01-21 NOTE — Addendum Note (Signed)
Addended by: Lottie Rater E on: 01/21/2017 04:43 PM   Modules accepted: Orders

## 2017-02-19 ENCOUNTER — Ambulatory Visit: Payer: Medicare Other | Admitting: Podiatry

## 2017-02-25 ENCOUNTER — Ambulatory Visit: Payer: Medicare Other | Admitting: Podiatry

## 2017-03-11 ENCOUNTER — Ambulatory Visit: Payer: Self-pay | Admitting: Podiatry

## 2017-03-20 ENCOUNTER — Ambulatory Visit (INDEPENDENT_AMBULATORY_CARE_PROVIDER_SITE_OTHER): Payer: Medicare Other | Admitting: Podiatry

## 2017-03-20 ENCOUNTER — Encounter: Payer: Self-pay | Admitting: Podiatry

## 2017-03-20 DIAGNOSIS — Z79899 Other long term (current) drug therapy: Secondary | ICD-10-CM | POA: Diagnosis not present

## 2017-03-20 DIAGNOSIS — L603 Nail dystrophy: Secondary | ICD-10-CM

## 2017-03-20 MED ORDER — TERBINAFINE HCL 250 MG PO TABS: 250.0000 mg | ORAL_TABLET | Freq: Every day | ORAL | 0 refills | Status: DC

## 2017-03-20 NOTE — Progress Notes (Signed)
She presents today for follow-up of her nail fungus. States that her feet are doing much better she's happy with the outcome of that. She states that she is ready to do something about these toenails.  Objective: Vital signs are stable she is alert and oriented 3. No change in physical exam. Per pathology results onychomycosis is present.  Assessment: Onychomycosis resolving plantar fasciitis.  Plan: After thorough discussion with her the pros and cons of medication topical therapy laser therapy and oral therapy at this point she is chosen oral therapy and would like to consider utilizing Lamisil. This should be most beneficial for her. I reviewed her past pressure medications and allergies currently there is no contraindication for her using Lamisil. 250 mg tablets were dispensed total 30. She will take one a day by mouth. I will follow-up with her in 1 month. We also requesting a liver profile which she received a requisition for today.

## 2017-03-21 LAB — HEPATIC FUNCTION PANEL
ALBUMIN: 4.3 g/dL (ref 3.6–4.8)
ALK PHOS: 80 IU/L (ref 39–117)
ALT: 12 IU/L (ref 0–32)
AST: 17 IU/L (ref 0–40)
BILIRUBIN TOTAL: 0.7 mg/dL (ref 0.0–1.2)
Bilirubin, Direct: 0.17 mg/dL (ref 0.00–0.40)
TOTAL PROTEIN: 6.7 g/dL (ref 6.0–8.5)

## 2017-04-24 ENCOUNTER — Ambulatory Visit (INDEPENDENT_AMBULATORY_CARE_PROVIDER_SITE_OTHER): Payer: Medicare Other | Admitting: Podiatry

## 2017-04-24 ENCOUNTER — Ambulatory Visit (INDEPENDENT_AMBULATORY_CARE_PROVIDER_SITE_OTHER): Payer: Medicare Other

## 2017-04-24 DIAGNOSIS — M779 Enthesopathy, unspecified: Secondary | ICD-10-CM

## 2017-04-24 NOTE — Progress Notes (Signed)
Follow-up plantar fasciitis states that my right foot is finally good but my left one has started hurting across the top.  Is been hurting and swelling.  Objective: Vital signs are stable alert and oriented x3.  Pulses are palpable.  Radiographs taken today do not demonstrate any type of major osseous abnormalities overlying Lisfranc's joints.  She has pain on palpation of the second and third tarsometatarsal junctions.  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  Plan: Injected this area today with Kenalog and local anesthetic after alcohol skin prep total of 20 mg was injected with 5 mg of Marcaine plain she tolerated procedure well.  Follow-up with her in 4-6 weeks.

## 2017-05-07 ENCOUNTER — Telehealth: Payer: Self-pay | Admitting: Podiatry

## 2017-05-07 NOTE — Telephone Encounter (Signed)
She needs an appointment and a liver profile before we prescribe more lamisil

## 2017-05-08 NOTE — Telephone Encounter (Signed)
Patient notified via voice mail to call office and set up appt to discuss restarting Lamisil and have lab work

## 2017-05-15 ENCOUNTER — Ambulatory Visit: Payer: Medicare Other | Admitting: Podiatry

## 2017-05-20 ENCOUNTER — Encounter: Payer: Self-pay | Admitting: Podiatry

## 2017-05-20 ENCOUNTER — Ambulatory Visit (INDEPENDENT_AMBULATORY_CARE_PROVIDER_SITE_OTHER): Payer: Medicare Other | Admitting: Podiatry

## 2017-05-20 DIAGNOSIS — M779 Enthesopathy, unspecified: Secondary | ICD-10-CM

## 2017-05-20 DIAGNOSIS — M7752 Other enthesopathy of left foot: Secondary | ICD-10-CM | POA: Diagnosis not present

## 2017-05-20 DIAGNOSIS — M778 Other enthesopathies, not elsewhere classified: Secondary | ICD-10-CM

## 2017-05-20 DIAGNOSIS — L600 Ingrowing nail: Secondary | ICD-10-CM

## 2017-05-20 MED ORDER — NEOMYCIN-POLYMYXIN-HC 1 % OT SOLN: OTIC | 1 refills | Status: DC

## 2017-05-20 MED ORDER — TERBINAFINE HCL 250 MG PO TABS: 250.0000 mg | ORAL_TABLET | Freq: Every day | ORAL | 0 refills | Status: DC

## 2017-05-20 NOTE — Progress Notes (Signed)
She presents today for follow-up of capsulitis dorsal aspect of the left foot.  She states that it still hurts on top and feels much better than it did previously.  Is also complaining of painful ingrown toenail to the left foot.  She continues to take her Lamisil therapy without complications.  Objective: Vital signs are stable she is alert and oriented x3.  Pulses are palpable.  She still has some edema on the dorsal aspect of the foot with mild pain on palpation of the deep peroneal nerve.  She also has tenderness with erythema and edema to the tibial and fibular borders of the hallux left.  The nail is thick discolored clinically mycotic.  Assessment: Ingrown nail paronychia abscess hallux left.  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  Onychomycosis.  Plan: Discussed etiology pathology conservative versus surgical therapies.  Refilled Lamisil therapy 30 tablets 1 every other day.  We also performed a chemical matrixectomy today after 3 cc of a 50-50 mixture of Marcaine plain and lidocaine plain was infiltrated in a hallux block left.  It was then prepped and draped in his normal sterile fashion.  The nails are split from distal to proximal and removed along the borders.  This exposed the nail bed and root.  3 applications of phenol 30 seconds each were applied to the nailbed and the roots.  Isopropyl alcohol was then used to neutralize the acid.  Silvadene cream Telfa pad and a dry sterile compressive dressing was applied.  She tolerated procedure well.  I also injected the dorsal aspect of the left foot with 20 mg of Kenalog 5 mg of Marcaine after sterile Betadine skin prep and verbal consent.  She tolerated this procedure well as well.  Follow-up with her in 2 weeks.  Provided her with both oral and written home-going instructions for the care and soaking of her toe as well as a prescription for Cortisporin Otic to be applied twice daily after soaking.

## 2017-05-20 NOTE — Patient Instructions (Signed)

## 2017-06-05 ENCOUNTER — Ambulatory Visit: Payer: Medicare Other | Admitting: Podiatry

## 2017-11-01 ENCOUNTER — Other Ambulatory Visit: Payer: Self-pay | Admitting: Obstetrics and Gynecology

## 2017-11-01 DIAGNOSIS — Z1231 Encounter for screening mammogram for malignant neoplasm of breast: Secondary | ICD-10-CM

## 2017-11-28 ENCOUNTER — Ambulatory Visit
Admission: RE | Admit: 2017-11-28 | Discharge: 2017-11-28 | Disposition: A | Discharge status: Home / Self Care | Payer: Medicare Other | Source: Ambulatory Visit | Attending: Obstetrics and Gynecology | Admitting: Obstetrics and Gynecology

## 2017-11-28 DIAGNOSIS — Z1231 Encounter for screening mammogram for malignant neoplasm of breast: Secondary | ICD-10-CM | POA: Diagnosis not present

## 2018-11-03 ENCOUNTER — Other Ambulatory Visit: Payer: Self-pay | Admitting: Obstetrics and Gynecology

## 2018-11-03 DIAGNOSIS — Z1231 Encounter for screening mammogram for malignant neoplasm of breast: Secondary | ICD-10-CM

## 2018-12-11 ENCOUNTER — Other Ambulatory Visit: Payer: Self-pay

## 2018-12-11 ENCOUNTER — Ambulatory Visit
Admission: RE | Admit: 2018-12-11 | Discharge: 2018-12-11 | Disposition: A | Discharge status: Home / Self Care | Payer: Medicare Other | Source: Ambulatory Visit | Attending: Obstetrics and Gynecology | Admitting: Obstetrics and Gynecology

## 2018-12-11 DIAGNOSIS — Z1231 Encounter for screening mammogram for malignant neoplasm of breast: Secondary | ICD-10-CM

## 2019-03-16 ENCOUNTER — Other Ambulatory Visit: Payer: Self-pay

## 2019-03-16 ENCOUNTER — Encounter

## 2019-03-16 ENCOUNTER — Ambulatory Visit (INDEPENDENT_AMBULATORY_CARE_PROVIDER_SITE_OTHER): Payer: Medicare Other | Admitting: Podiatry

## 2019-03-16 ENCOUNTER — Ambulatory Visit (INDEPENDENT_AMBULATORY_CARE_PROVIDER_SITE_OTHER): Payer: Medicare Other

## 2019-03-16 ENCOUNTER — Other Ambulatory Visit: Payer: Self-pay | Admitting: Podiatry

## 2019-03-16 ENCOUNTER — Encounter: Payer: Self-pay | Admitting: Podiatry

## 2019-03-16 DIAGNOSIS — L603 Nail dystrophy: Secondary | ICD-10-CM | POA: Diagnosis not present

## 2019-03-16 DIAGNOSIS — Z79899 Other long term (current) drug therapy: Secondary | ICD-10-CM | POA: Diagnosis not present

## 2019-03-16 DIAGNOSIS — M779 Enthesopathy, unspecified: Secondary | ICD-10-CM

## 2019-03-16 DIAGNOSIS — L600 Ingrowing nail: Secondary | ICD-10-CM

## 2019-03-16 DIAGNOSIS — M7672 Peroneal tendinitis, left leg: Secondary | ICD-10-CM

## 2019-03-16 MED ORDER — TERBINAFINE HCL 250 MG PO TABS: 250.0000 mg | ORAL_TABLET | Freq: Every day | ORAL | 0 refills | Status: DC

## 2019-03-16 MED ORDER — NEOMYCIN-POLYMYXIN-HC 1 % OT SOLN: OTIC | 1 refills | Status: DC

## 2019-03-16 NOTE — Progress Notes (Signed)
She presents today for chief complaint of pain to the left ankle around the anterior lateral aspect started about a month ago denies any trauma to it.  States that she was seen at urgent care x-rays were done diagnosed with osteoarthritis flareup was given a steroid injection and meloxicam as well as an ankle brace.  She reports that is still having pains on the lateral side of the ankle but the brace has helped.  She is also complaining of an ingrown toenail tibiofibular border of the hallux right and thick fungus that has reoccurred.  Objective: Vital signs are stable she alert and oriented x3 pulses are palpable neurologic sensorium is intact he can reflexes are intact muscle strength is normal symmetrical.  She does have tenderness on palpation of the peroneal tendons just below the level of the lateral malleolus without swelling.  Radiographs taken today do not demonstrate any type of osseous abnormalities to the ankle joint.  Sharply rated nail margin along the tibiofibular border the hallux right.  Mild erythema no purulence no malodor.  Assessment: Peroneal tendinitis left.  Osteoarthritis left foot and ankle.  Ingrown toenail hallux right.  Plan: Encouraged her to continue to stay in the brace as long as she is improving on the left foot.  Right foot we performed chemical matrixectomy after local anesthesia was administered she tolerated the procedure well without complications.  Also wrote another prescription for Lamisil.  Also requested liver enzymes.  I will follow-up with her in the near future for reevaluation matrixectomy was performed today.

## 2019-03-16 NOTE — Patient Instructions (Signed)

## 2019-03-18 LAB — HEPATIC FUNCTION PANEL
ALT: 14 IU/L (ref 0–32)
AST: 18 IU/L (ref 0–40)
Albumin: 4.5 g/dL (ref 3.8–4.8)
Alkaline Phosphatase: 87 IU/L (ref 39–117)
Bilirubin Total: 0.4 mg/dL (ref 0.0–1.2)
Bilirubin, Direct: 0.11 mg/dL (ref 0.00–0.40)
Total Protein: 6.6 g/dL (ref 6.0–8.5)

## 2019-03-25 ENCOUNTER — Telehealth: Payer: Self-pay | Admitting: *Deleted

## 2019-03-25 MED ORDER — TERBINAFINE HCL 250 MG PO TABS: 250.0000 mg | ORAL_TABLET | Freq: Every day | ORAL | 0 refills | Status: DC

## 2019-03-25 NOTE — Telephone Encounter (Signed)
-----   Message from Garrel Ridgel, [place] sent at 03/25/2019  7:00 AM EST ----- Blood work looks good she may continue her medication and will follow-up at her next scheduled appointment.

## 2019-03-31 ENCOUNTER — Ambulatory Visit: Payer: Self-pay

## 2019-04-01 ENCOUNTER — Encounter: Payer: Self-pay | Admitting: Podiatry

## 2019-04-01 ENCOUNTER — Other Ambulatory Visit: Payer: Self-pay

## 2019-04-01 ENCOUNTER — Ambulatory Visit (INDEPENDENT_AMBULATORY_CARE_PROVIDER_SITE_OTHER): Payer: Self-pay | Admitting: Podiatry

## 2019-04-01 DIAGNOSIS — L600 Ingrowing nail: Secondary | ICD-10-CM

## 2019-04-01 DIAGNOSIS — Z9889 Other specified postprocedural states: Secondary | ICD-10-CM

## 2019-04-01 NOTE — Patient Instructions (Signed)

## 2019-04-01 NOTE — Progress Notes (Signed)
She presents today for postop visit regarding her hallux right matrixectomy.  She states that she is doing pretty well continues to soak in Betadine and warm water.  She continues to take the Lamisil on a regular basis now that she has been on it for 2 weeks.  Objective: Vital signs are stable she is alert and oriented x3.  Pulses are palpable.  Considerable Betadine discoloration to the foot however the matrixectomy appears to demonstrate eschars along the tibiofibular border of the right hallux with no erythema cellulitis drainage or odor.  Appears to be healing well.  Contralateral foot does demonstrate a sharply incurvated tibial margin of the hallux left.  Assessment: Matrixectomy healing right.  Sharply rated nail margin tibial border hallux left.  Plan: Continue to soak Epson salts and warm water every other day for about a week or 2.  Follow-up with her in a couple of weeks.  In 4 weeks we will request another liver profile and write another prescription for Lamisil No. 90, 1 p.o. daily we may also perform chemical matrixectomy tibial border hallux left.

## 2019-04-03 ENCOUNTER — Other Ambulatory Visit: Payer: Self-pay

## 2019-04-03 ENCOUNTER — Ambulatory Visit (LOCAL_COMMUNITY_HEALTH_CENTER): Payer: Medicare Other

## 2019-04-03 DIAGNOSIS — Z23 Encounter for immunization: Secondary | ICD-10-CM | POA: Diagnosis not present

## 2019-04-03 NOTE — Progress Notes (Signed)
Tdap given; tolerated well.Abbygayle Helfand, RN  

## 2019-04-10 DIAGNOSIS — I10 Essential (primary) hypertension: Secondary | ICD-10-CM | POA: Insufficient documentation

## 2019-05-06 ENCOUNTER — Encounter: Payer: Self-pay | Admitting: Podiatry

## 2019-05-06 ENCOUNTER — Ambulatory Visit (INDEPENDENT_AMBULATORY_CARE_PROVIDER_SITE_OTHER): Payer: Medicare Other | Admitting: Podiatry

## 2019-05-06 ENCOUNTER — Other Ambulatory Visit: Payer: Self-pay

## 2019-05-06 DIAGNOSIS — L600 Ingrowing nail: Secondary | ICD-10-CM

## 2019-05-06 DIAGNOSIS — Z79899 Other long term (current) drug therapy: Secondary | ICD-10-CM

## 2019-05-06 DIAGNOSIS — L603 Nail dystrophy: Secondary | ICD-10-CM

## 2019-05-06 MED ORDER — TERBINAFINE HCL 250 MG PO TABS: 250.0000 mg | ORAL_TABLET | Freq: Every day | ORAL | 0 refills | Status: DC

## 2019-05-06 MED ORDER — NEOMYCIN-POLYMYXIN-HC 1 % OT SOLN: OTIC | 1 refills | Status: DC

## 2019-05-06 NOTE — Progress Notes (Signed)
She presents today for follow-up of her nail fungus.  States that she is completed her first 30 days of Lamisil with no problems.  She like to go ahead and get this other nail taken care of as she refers to the ingrown toenail tibial border hallux left.  Objective: Vital signs are stable alert and oriented x3.  Pulses are palpable.  Sharp incurvated nail margins hallux left.  Previous matrixectomy is healing on the right foot without complications.  Assessment: Ingrown nail paronychia abscess with onychomycosis hallux left.  Plan: We will continue therapy with Lamisil after requesting a liver profile.  Should this come back abnormal we will notify her immediately.  However I do feel that go ahead for matrixectomy today would be necessary for the hallux left she tolerated the procedure well without complications and I will follow-up with her in a couple weeks.

## 2019-05-07 LAB — HEPATIC FUNCTION PANEL
ALT: 11 IU/L (ref 0–32)
AST: 15 IU/L (ref 0–40)
Albumin: 4.7 g/dL (ref 3.8–4.8)
Alkaline Phosphatase: 76 IU/L (ref 39–117)
Bilirubin Total: 0.7 mg/dL (ref 0.0–1.2)
Bilirubin, Direct: 0.14 mg/dL (ref 0.00–0.40)
Total Protein: 7.3 g/dL (ref 6.0–8.5)

## 2019-05-08 ENCOUNTER — Telehealth: Payer: Self-pay | Admitting: *Deleted

## 2019-05-08 NOTE — Telephone Encounter (Signed)
-----   Message from Elinor Parkinson, [place] sent at 05/07/2019  6:29 AM EST ----- Blood work looks good and may continue medication.

## 2019-05-08 NOTE — Telephone Encounter (Signed)
I informed pt of Dr. Hyatt's review of results and orders. 

## 2019-05-20 ENCOUNTER — Ambulatory Visit (INDEPENDENT_AMBULATORY_CARE_PROVIDER_SITE_OTHER): Payer: Medicare Other | Admitting: Podiatry

## 2019-05-20 ENCOUNTER — Other Ambulatory Visit: Payer: Self-pay

## 2019-05-20 ENCOUNTER — Encounter: Payer: Self-pay | Admitting: Podiatry

## 2019-05-20 DIAGNOSIS — L600 Ingrowing nail: Secondary | ICD-10-CM

## 2019-05-20 NOTE — Progress Notes (Signed)
She presents today for follow-up of her nail procedure.  States that they are doing a lot better now.  States that I can actually wear closed in shoes.  She states that she has been soaking Epson salts and warm water.  Objective: Vital signs are stable alert and oriented x3.  Procedures have gone on to heal uneventfully with mild erythema proximally to the hallux.  Assessment: Long-term therapy with Lamisil and resolving matrixectomy's hallux bilateral.  Plan: Discussed etiology pathology conservative surgical therapies at this point went ahead and recommend that she continue to soak in Epson salts and warm water at least every other day until completely resolved..  I will follow-up with her in about 3 months for Lamisil discussion.

## 2019-09-01 ENCOUNTER — Other Ambulatory Visit: Payer: Self-pay

## 2019-09-01 ENCOUNTER — Encounter: Payer: Self-pay | Admitting: Ophthalmology

## 2019-09-04 ENCOUNTER — Other Ambulatory Visit
Admission: RE | Admit: 2019-09-04 | Discharge: 2019-09-04 | Disposition: A | Discharge status: Home / Self Care | Payer: Medicare Other | Source: Ambulatory Visit | Attending: Ophthalmology | Admitting: Ophthalmology

## 2019-09-04 ENCOUNTER — Other Ambulatory Visit: Payer: Self-pay

## 2019-09-04 DIAGNOSIS — Z01812 Encounter for preprocedural laboratory examination: Secondary | ICD-10-CM | POA: Insufficient documentation

## 2019-09-04 DIAGNOSIS — Z20822 Contact with and (suspected) exposure to covid-19: Secondary | ICD-10-CM | POA: Diagnosis not present

## 2019-09-04 LAB — SARS CORONAVIRUS 2 (TAT 6-24 HRS): SARS Coronavirus 2: NEGATIVE

## 2019-09-07 NOTE — Discharge Instructions (Signed)

## 2019-09-08 ENCOUNTER — Other Ambulatory Visit: Payer: Self-pay

## 2019-09-08 ENCOUNTER — Encounter
Admission: RE | Disposition: A | Discharge status: Home / Self Care | Payer: Self-pay | Source: Home / Self Care | Attending: Ophthalmology

## 2019-09-08 ENCOUNTER — Ambulatory Visit
Admission: RE | Admit: 2019-09-08 | Discharge: 2019-09-08 | Disposition: A | Discharge status: Home / Self Care | Payer: Medicare Other | Attending: Ophthalmology | Admitting: Ophthalmology

## 2019-09-08 ENCOUNTER — Ambulatory Visit: Payer: Medicare Other | Admitting: Anesthesiology

## 2019-09-08 ENCOUNTER — Encounter: Payer: Self-pay | Admitting: Ophthalmology

## 2019-09-08 DIAGNOSIS — H2511 Age-related nuclear cataract, right eye: Secondary | ICD-10-CM | POA: Diagnosis not present

## 2019-09-08 DIAGNOSIS — Z87891 Personal history of nicotine dependence: Secondary | ICD-10-CM | POA: Insufficient documentation

## 2019-09-08 DIAGNOSIS — Z90710 Acquired absence of both cervix and uterus: Secondary | ICD-10-CM | POA: Diagnosis not present

## 2019-09-08 DIAGNOSIS — M1990 Unspecified osteoarthritis, unspecified site: Secondary | ICD-10-CM | POA: Diagnosis not present

## 2019-09-08 DIAGNOSIS — Z981 Arthrodesis status: Secondary | ICD-10-CM | POA: Insufficient documentation

## 2019-09-08 DIAGNOSIS — Z9842 Cataract extraction status, left eye: Secondary | ICD-10-CM | POA: Insufficient documentation

## 2019-09-08 DIAGNOSIS — H59021 Cataract (lens) fragments in eye following cataract surgery, right eye: Secondary | ICD-10-CM | POA: Insufficient documentation

## 2019-09-08 DIAGNOSIS — H9319 Tinnitus, unspecified ear: Secondary | ICD-10-CM | POA: Diagnosis not present

## 2019-09-08 HISTORY — DX: Presence of external hearing-aid: Z97.4

## 2019-09-08 HISTORY — PX: CATARACT EXTRACTION W/PHACO: SHX586

## 2019-09-08 SURGERY — PHACOEMULSIFICATION, CATARACT, WITH IOL INSERTION
Anesthesia: Monitor Anesthesia Care | Site: Eye | Laterality: Right

## 2019-09-08 MED ORDER — NA CHONDROIT SULF-NA HYALURON 40-17 MG/ML IO SOLN
INTRAOCULAR | Status: DC | PRN
  Administered 2019-09-08: 1 mL via INTRAOCULAR

## 2019-09-08 MED ORDER — LIDOCAINE HCL (PF) 2 % IJ SOLN
INTRAOCULAR | Status: DC | PRN
  Administered 2019-09-08: 1 mL

## 2019-09-08 MED ORDER — TETRACAINE HCL 0.5 % OP SOLN
1.0000 [drp] | OPHTHALMIC | Status: DC | PRN
  Administered 2019-09-08 (×3): 1 [drp] via OPHTHALMIC

## 2019-09-08 MED ORDER — ARMC OPHTHALMIC DILATING DROPS
1.0000 "application " | OPHTHALMIC | Status: DC | PRN
  Administered 2019-09-08 (×3): 1 via OPHTHALMIC

## 2019-09-08 MED ORDER — BRIMONIDINE TARTRATE-TIMOLOL 0.2-0.5 % OP SOLN
OPHTHALMIC | Status: DC | PRN
  Administered 2019-09-08: 1 [drp] via OPHTHALMIC

## 2019-09-08 MED ORDER — MIDAZOLAM HCL 2 MG/2ML IJ SOLN
INTRAMUSCULAR | Status: DC | PRN
  Administered 2019-09-08 (×2): 1 mg via INTRAVENOUS

## 2019-09-08 MED ORDER — MOXIFLOXACIN HCL 0.5 % OP SOLN
OPHTHALMIC | Status: DC | PRN
  Administered 2019-09-08: 0.2 mL via OPHTHALMIC

## 2019-09-08 MED ORDER — LACTATED RINGERS IV SOLN: 100.0000 mL/h | INTRAVENOUS | Status: DC

## 2019-09-08 MED ORDER — ONDANSETRON HCL 4 MG/2ML IJ SOLN: 4.0000 mg | Freq: Once | INTRAMUSCULAR | Status: DC | PRN

## 2019-09-08 MED ORDER — ACETAMINOPHEN 10 MG/ML IV SOLN: 1000.0000 mg | Freq: Once | INTRAVENOUS | Status: DC | PRN

## 2019-09-08 MED ORDER — EPINEPHRINE PF 1 MG/ML IJ SOLN
INTRAOCULAR | Status: DC | PRN
  Administered 2019-09-08: 63 mL via OPHTHALMIC

## 2019-09-08 MED ORDER — FENTANYL CITRATE (PF) 100 MCG/2ML IJ SOLN
INTRAMUSCULAR | Status: DC | PRN
  Administered 2019-09-08 (×2): 50 ug via INTRAVENOUS

## 2019-09-08 SURGICAL SUPPLY — 20 items
CANNULA ANT/CHMB 27G (MISCELLANEOUS) ×2 IMPLANT
CANNULA ANT/CHMB 27GA (MISCELLANEOUS) ×6 IMPLANT
GLOVE SURG LX 8.0 MICRO (GLOVE) ×2
GLOVE SURG LX STRL 8.0 MICRO (GLOVE) ×1 IMPLANT
GLOVE SURG TRIUMPH 8.0 PF LTX (GLOVE) ×3 IMPLANT
GOWN STRL REUS W/ TWL LRG LVL3 (GOWN DISPOSABLE) ×2 IMPLANT
GOWN STRL REUS W/TWL LRG LVL3 (GOWN DISPOSABLE) ×4
LENS IOL ACRSF IQ ULTRA 15.5 (Intraocular Lens) IMPLANT
LENS IOL ACRYSOF IQ 15.5 (Intraocular Lens) ×3 IMPLANT
MARKER SKIN DUAL TIP RULER LAB (MISCELLANEOUS) ×3 IMPLANT
NDL FILTER BLUNT 18X1 1/2 (NEEDLE) ×1 IMPLANT
NEEDLE FILTER BLUNT 18X 1/2SAF (NEEDLE) ×2
NEEDLE FILTER BLUNT 18X1 1/2 (NEEDLE) ×1 IMPLANT
PACK EYE AFTER SURG (MISCELLANEOUS) ×3 IMPLANT
PACK OPTHALMIC (MISCELLANEOUS) ×3 IMPLANT
PACK PORFILIO (MISCELLANEOUS) ×3 IMPLANT
SYR 3ML LL SCALE MARK (SYRINGE) ×3 IMPLANT
SYR TB 1ML LUER SLIP (SYRINGE) ×3 IMPLANT
WATER STERILE IRR 250ML POUR (IV SOLUTION) ×3 IMPLANT
WIPE NON LINTING 3.25X3.25 (MISCELLANEOUS) ×3 IMPLANT

## 2019-09-08 NOTE — Transfer of Care (Signed)
Immediate Anesthesia Transfer of Care Note  Patient: Angela Walters  Procedure(s) Performed: CATARACT EXTRACTION PHACO AND INTRAOCULAR LENS PLACEMENT (IOC) RIGHT 7.44  00:53.6 (Right Eye)  Patient Location: PACU  Anesthesia Type: MAC  Level of Consciousness: awake, alert  and patient cooperative  Airway and Oxygen Therapy: Patient Spontanous Breathing   Post-op Assessment: Post-op Vital signs reviewed, Patient's Cardiovascular Status Stable, Respiratory Function Stable, Patent Airway and No signs of Nausea or vomiting  Post-op Vital Signs: Reviewed and stable  Complications: No apparent anesthesia complications

## 2019-09-08 NOTE — Op Note (Signed)
PREOPERATIVE DIAGNOSIS:  Nuclear sclerotic cataract of the right eye.   POSTOPERATIVE DIAGNOSIS:  H25.11 Cataract   OPERATIVE PROCEDURE:@   SURGEON:  Galen Manila, MD.   ANESTHESIA:  Anesthesiologist: Heniser, Burman Foster, MD CRNA: Jiles Garter, CRNA  1.      Managed anesthesia care. 2.      0.60ml of Shugarcaine was instilled in the eye following the paracentesis.   COMPLICATIONS:  None.   TECHNIQUE:   Stop and chop   DESCRIPTION OF PROCEDURE:  The patient was examined and consented in the preoperative holding area where the aforementioned topical anesthesia was applied to the right eye and then brought back to the Operating Room where the right eye was prepped and draped in the usual sterile ophthalmic fashion and a lid speculum was placed. A paracentesis was created with the side port blade and the anterior chamber was filled with viscoelastic. A near clear corneal incision was performed with the steel keratome. A continuous curvilinear capsulorrhexis was performed with a cystotome followed by the capsulorrhexis forceps. Hydrodissection and hydrodelineation were carried out with BSS on a blunt cannula. The lens was removed in a stop and chop  technique and the remaining cortical material was removed with the irrigation-aspiration handpiece. The capsular bag was inflated with viscoelastic and the Technis [id number]  lens was placed in the capsular bag without complication. The remaining viscoelastic was removed from the eye with the irrigation-aspiration handpiece. The wounds were hydrated. The anterior chamber was flushed with BSS and the eye was inflated to physiologic pressure. 0.7ml of Vigamox was placed in the anterior chamber. The wounds were found to be water tight. The eye was dressed with Combigan. The patient was given protective glasses to wear throughout the day and a shield with which to sleep tonight. The patient was also given drops with which to begin a drop regimen today and will  follow-up with me in one day. Implant Name Type Inv. Item Serial No. Manufacturer Lot No. LRB No. Used Action  LENS IOL ACRYSOF IQ 15.5 - [id number] Intraocular Lens LENS IOL ACRYSOF IQ 15.5 [id number] ALCON  Right 1 Implanted   Procedure(s): CATARACT EXTRACTION PHACO AND INTRAOCULAR LENS PLACEMENT (IOC) RIGHT 7.44  00:53.6 (Right)  Electronically signed: Galen Manila 09/08/2019 8:46 AM

## 2019-09-08 NOTE — Anesthesia Procedure Notes (Signed)
Procedure Name: MAC Date/Time: 09/08/2019 8:28 AM Performed by: Vanetta Shawl, CRNA Pre-anesthesia Checklist: Patient identified, Emergency Drugs available, Suction available, Timeout performed and Patient being monitored Patient Re-evaluated:Patient Re-evaluated prior to induction Oxygen Delivery Method: Nasal cannula Placement Confirmation: positive ETCO2

## 2019-09-08 NOTE — H&P (Signed)
All labs reviewed. Abnormal studies sent to patients PCP when indicated.  Previous H&P reviewed, patient examined, there are NO CHANGES.  William Porfilio5/18/20218:19 AM

## 2019-09-08 NOTE — Anesthesia Preprocedure Evaluation (Signed)
Anesthesia Evaluation  Patient identified by MRN, date of birth, ID band Patient awake    Reviewed: Allergy & Precautions, NPO status , Patient's Chart, lab work & pertinent test results, reviewed documented beta blocker date and time   History of Anesthesia Complications Negative for: history of anesthetic complications  Airway Mallampati: I  TM Distance: >3 FB Neck ROM: Full    Dental   Pulmonary former smoker,    breath sounds clear to auscultation       Cardiovascular (-) angina(-) DOE  Rhythm:Regular Rate:Normal     Neuro/Psych    GI/Hepatic neg GERD  ,  Endo/Other    Renal/GU      Musculoskeletal  (+) Arthritis ,   Abdominal   Peds  Hematology   Anesthesia Other Findings   Reproductive/Obstetrics                             Anesthesia Physical Anesthesia Plan  ASA: I  Anesthesia Plan: MAC   Post-op Pain Management:    Induction: Intravenous  PONV Risk Score and Plan: 2 and TIVA, Midazolam and Treatment may vary due to age or medical condition  Airway Management Planned: Nasal Cannula  Additional Equipment:   Intra-op Plan:   Post-operative Plan:   Informed Consent: I have reviewed the patients History and Physical, chart, labs and discussed the procedure including the risks, benefits and alternatives for the proposed anesthesia with the patient or authorized representative who has indicated his/her understanding and acceptance.       Plan Discussed with: CRNA and Anesthesiologist  Anesthesia Plan Comments:         Anesthesia Quick Evaluation

## 2019-09-08 NOTE — Anesthesia Postprocedure Evaluation (Signed)
Anesthesia Post Note  Patient: Angela Walters  Procedure(s) Performed: CATARACT EXTRACTION PHACO AND INTRAOCULAR LENS PLACEMENT (IOC) RIGHT 7.44  00:53.6 (Right Eye)     Patient location during evaluation: PACU Anesthesia Type: MAC Level of consciousness: awake and alert Pain management: pain level controlled Vital Signs Assessment: post-procedure vital signs reviewed and stable Respiratory status: spontaneous breathing, nonlabored ventilation, respiratory function stable and patient connected to nasal cannula oxygen Cardiovascular status: stable and blood pressure returned to baseline Postop Assessment: no apparent nausea or vomiting Anesthetic complications: no    Erica A  Heniser

## 2019-09-09 ENCOUNTER — Encounter: Payer: Self-pay | Admitting: Podiatry

## 2019-09-09 ENCOUNTER — Ambulatory Visit (INDEPENDENT_AMBULATORY_CARE_PROVIDER_SITE_OTHER): Payer: Medicare Other

## 2019-09-09 ENCOUNTER — Other Ambulatory Visit: Payer: Self-pay | Admitting: Podiatry

## 2019-09-09 ENCOUNTER — Ambulatory Visit (INDEPENDENT_AMBULATORY_CARE_PROVIDER_SITE_OTHER): Payer: Medicare Other | Admitting: Podiatry

## 2019-09-09 DIAGNOSIS — M67472 Ganglion, left ankle and foot: Secondary | ICD-10-CM

## 2019-09-09 DIAGNOSIS — L603 Nail dystrophy: Secondary | ICD-10-CM

## 2019-09-09 DIAGNOSIS — M85672 Other cyst of bone, left ankle and foot: Secondary | ICD-10-CM

## 2019-09-09 MED ORDER — TERBINAFINE HCL 250 MG PO TABS: 250.0000 mg | ORAL_TABLET | Freq: Every day | ORAL | 0 refills | Status: DC

## 2019-09-09 NOTE — Progress Notes (Signed)
She presents today for follow-up of her Lamisil therapy.  She states I think my my nails are getting better she says I had no problems taking the medicine denies fever chills nausea vomiting muscle aches pains calf pain back pain chest pain shortness of breath.  She does state that she has some tenderness on the lateral aspect of her left foot for a ganglion cyst appears to be growing.  She states that it rubs her shoe and it is painful.  Objective: Vital signs are stable she alert oriented x3 pulses are palpable.  Nails appear to be resolving slowly they are much improved.  Soft tissue mass nonpulsatile in nature, firm but fluctuant overlies the fifth and fourth metatarsal bases.  Assessment: Long-term therapy for onychomycosis with Lamisil.  Ganglion cyst lobulated left foot.  Plan: After local anesthetic was injected into the skin I was able to perform a aspiration of the ganglion with injection of Kenalog.  I placed a compression dressing.  We will start taking Lamisil every other day for a number of 60 days and I will follow-up with her in 3 months.

## 2019-09-16 ENCOUNTER — Ambulatory Visit: Payer: Medicare Other | Admitting: Podiatry

## 2019-10-19 DIAGNOSIS — I8311 Varicose veins of right lower extremity with inflammation: Secondary | ICD-10-CM | POA: Insufficient documentation

## 2019-10-19 DIAGNOSIS — I8391 Asymptomatic varicose veins of right lower extremity: Secondary | ICD-10-CM | POA: Insufficient documentation

## 2019-11-02 ENCOUNTER — Other Ambulatory Visit: Payer: Self-pay | Admitting: Obstetrics and Gynecology

## 2019-11-02 DIAGNOSIS — Z1231 Encounter for screening mammogram for malignant neoplasm of breast: Secondary | ICD-10-CM

## 2019-11-23 ENCOUNTER — Other Ambulatory Visit (INDEPENDENT_AMBULATORY_CARE_PROVIDER_SITE_OTHER): Payer: Self-pay | Admitting: Nurse Practitioner

## 2019-11-23 ENCOUNTER — Ambulatory Visit (INDEPENDENT_AMBULATORY_CARE_PROVIDER_SITE_OTHER): Payer: Medicare Other | Admitting: Nurse Practitioner

## 2019-11-23 ENCOUNTER — Other Ambulatory Visit: Payer: Self-pay

## 2019-11-23 ENCOUNTER — Ambulatory Visit (INDEPENDENT_AMBULATORY_CARE_PROVIDER_SITE_OTHER): Payer: Medicare Other

## 2019-11-23 ENCOUNTER — Encounter (INDEPENDENT_AMBULATORY_CARE_PROVIDER_SITE_OTHER): Payer: Self-pay | Admitting: Nurse Practitioner

## 2019-11-23 VITALS — BP 136/71 | HR 70 | Resp 17 | Ht 64.0 in | Wt 164.0 lb

## 2019-11-23 DIAGNOSIS — I83899 Varicose veins of unspecified lower extremities with other complications: Secondary | ICD-10-CM

## 2019-11-23 DIAGNOSIS — E785 Hyperlipidemia, unspecified: Secondary | ICD-10-CM

## 2019-11-23 DIAGNOSIS — I8312 Varicose veins of left lower extremity with inflammation: Secondary | ICD-10-CM | POA: Diagnosis not present

## 2019-11-23 DIAGNOSIS — I8311 Varicose veins of right lower extremity with inflammation: Secondary | ICD-10-CM

## 2019-11-24 ENCOUNTER — Encounter (INDEPENDENT_AMBULATORY_CARE_PROVIDER_SITE_OTHER): Payer: Self-pay | Admitting: Nurse Practitioner

## 2019-11-24 NOTE — Progress Notes (Signed)
Subjective:    Patient ID: Angela Walters, female    DOB: April 16, 1951, 69 y.o.   MRN: [id number] Chief Complaint  Patient presents with  . Follow-up    ultrasound    The patient is seen for evaluation of symptomatic varicose veins. The patient relates burning and stinging which worsened steadily throughout the course of the day, particularly with standing. The patient also notes an aching and throbbing pain over the varicosities, particularly with prolonged dependent positions. The symptoms are significantly improved with elevation.  The patient also notes that during hot weather the symptoms are greatly intensified. The patient states the pain from the varicose veins interferes with work, daily exercise, shopping and household maintenance. At this point, the symptoms are persistent and severe enough that they're having a negative impact on lifestyle and are interfering with daily activities.  There is no history of DVT, PE or superficial thrombophlebitis. There is no history of ulceration or hemorrhage. The patient denies a significant family history of varicose veins.   The patient has  worn graduated compression in the past. At the present time the patient has been using over-the-counter analgesics. There is no history of prior surgical intervention or sclerotherapy.   Today noninvasive studies show no evidence of DVT in the right lower extremity.  No evidence of superficial thrombosis.  The patient does have evidence of superficial venous reflux beginning in the great saphenous vein at the saphenofemoral junction extending to the proximal calf.  The patient has no evidence of deep venous insufficiency.  At the vein diameters range from 0.38 cm to 0.76.  There is also a large cluster of varicosities in the popliteal/calf area.   Review of Systems  Cardiovascular: Positive for leg swelling.  All other systems reviewed and are negative.      Objective:   Physical Exam Vitals reviewed.   HENT:     Head: Normocephalic.  Cardiovascular:     Rate and Rhythm: Normal rate and regular rhythm.     Pulses: Normal pulses.     Comments: Large cluster of varicosities in right calf  Pulmonary:     Effort: Pulmonary effort is normal.  Skin:    General: Skin is warm and dry.     Capillary Refill: Capillary refill takes less than 2 seconds.  Neurological:     Mental Status: She is alert and oriented to person, place, and time.  Psychiatric:        Mood and Affect: Mood normal.        Behavior: Behavior normal.        Thought Content: Thought content normal.        Judgment: Judgment normal.     BP 136/71 (BP Location: Right Arm)   Pulse 70   Resp 17   Ht 5\' 4"  (1.626 m)   Wt 164 lb (74.4 kg)   BMI 28.15 kg/m   Past Medical History:  Diagnosis Date  . Arthritis   . Frequent urination   . Frequent urination at night   . Wears hearing aid in both ears     Social History   Socioeconomic History  . Marital status: Widowed    Spouse name: Not on file  . Number of children: Not on file  . Years of education: Not on file  . Highest education level: Not on file  Occupational History  . Not on file  Tobacco Use  . Smoking status: Former Smoker    Years: 10.00  Quit date: 57    Years since quitting: 41.6  . Smokeless tobacco: Never Used  . Tobacco comment: quit at age 17  Vaping Use  . Vaping Use: Never used  Substance and Sexual Activity  . Alcohol use: Yes    Alcohol/week: 2.0 standard drinks    Types: 2 Glasses of wine per week  . Drug use: No  . Sexual activity: Not on file  Other Topics Concern  . Not on file  Social History Narrative  . Not on file   Social Determinants of Health   Financial Resource Strain:   . Difficulty of Paying Living Expenses:   Food Insecurity:   . Worried About Programme researcher, broadcasting/film/video in the Last Year:   . Barista in the Last Year:   Transportation Needs:   . Freight forwarder (Medical):   Marland Kitchen Lack of  Transportation (Non-Medical):   Physical Activity:   . Days of Exercise per Week:   . Minutes of Exercise per Session:   Stress:   . Feeling of Stress :   Social Connections:   . Frequency of Communication with Friends and Family:   . Frequency of Social Gatherings with Friends and Family:   . Attends Religious Services:   . Active Member of Clubs or Organizations:   . Attends Banker Meetings:   Marland Kitchen Marital Status:   Intimate Partner Violence:   . Fear of Current or Ex-Partner:   . Emotionally Abused:   Marland Kitchen Physically Abused:   . Sexually Abused:     Past Surgical History:  Procedure Laterality Date  . Buninectomy Right 2005  . CATARACT EXTRACTION W/PHACO Right 09/08/2019   Procedure: CATARACT EXTRACTION PHACO AND INTRAOCULAR LENS PLACEMENT (IOC) RIGHT 7.44  00:53.6;  Surgeon: Galen Manila, MD;  Location: [facility];  Service: Ophthalmology;  Laterality: Right;  . COLONOSCOPY    . Conezation    . HYSTERECTOMY ABDOMINAL WITH SALPINGECTOMY  2000  . LUMBAR FUSION  2017   L3-4-5  . removal milk duct Left 1985    Family History  Problem Relation Age of Onset  . Breast cancer Neg Hx     No Known Allergies     Assessment & Plan:   1. Varicose veins of both lower extremities with inflammation Recommend  I have reviewed my previous  discussion with the patient regarding  varicose veins and why they cause symptoms. Patient will continue  wearing graduated compression stockings class 1 on a daily basis, beginning first thing in the morning and removing them in the evening.    In addition, behavioral modification including elevation during the day was again discussed and this will continue.  The patient has utilized over the counter pain medications and has been exercising.  However, at this time conservative therapy has not alleviated the patient's symptoms of leg pain and swelling  Recommend: laser ablation of the right  great saphenous veins to  eliminate the symptoms of pain and swelling of the lower extremities caused by the severe superficial venous reflux disease.   2. Hyperlipidemia, unspecified hyperlipidemia type Continue statin as ordered and reviewed, no changes at this time    Current Outpatient Medications on File Prior to Visit  Medication Sig Dispense Refill  . Ascorbic Acid (VITAMIN C) 125 MG CHEW Chew by mouth daily.    . Calcium Carbonate (CALCIUM 600 PO) Take by mouth daily.    . Cholecalciferol (VITAMIN D-3) 25 MCG (1000 UT) CAPS Take  by mouth daily.    . fluticasone (VERAMYST) 27.5 MCG/SPRAY nasal spray Place 2 sprays into the nose daily as needed for rhinitis.    . Ginkgo Biloba 40 MG TABS Take 60 mg by mouth daily.    . Homeopathic Products (LEG CRAMP COMPLEX PO) Take by mouth daily.    . magnesium oxide (MAG-OX) 400 MG tablet Take 400 mg by mouth daily.    . Omega-3 Fatty Acids (FISH OIL) 1200 MG CAPS Take by mouth daily.    . Potassium 99 MG TABS Take by mouth daily.    . Rhubarb (ESTROVEN COMPLETE PO) Take by mouth daily.    . Turmeric Curcumin 500 MG CAPS Take by mouth daily.    . Zinc 50 MG TABS Take by mouth daily.    . NEOMYCIN-POLYMYXIN-HYDROCORTISONE (CORTISPORIN) 1 % SOLN OTIC solution Apply 1-2 drops to toe BID after soaking (Patient not taking: Reported on 11/23/2019) 10 mL 1  . terbinafine (LAMISIL) 250 MG tablet Take 1 tablet (250 mg total) by mouth daily. 30 tablet 0   No current facility-administered medications on file prior to visit.    There are no Patient Instructions on file for this visit. No follow-ups on file.   Georgiana Spinner, NP

## 2019-12-15 ENCOUNTER — Ambulatory Visit
Admission: RE | Admit: 2019-12-15 | Discharge: 2019-12-15 | Disposition: A | Discharge status: Home / Self Care | Payer: Medicare Other | Source: Ambulatory Visit | Attending: Obstetrics and Gynecology | Admitting: Obstetrics and Gynecology

## 2019-12-15 ENCOUNTER — Other Ambulatory Visit: Payer: Self-pay

## 2019-12-15 DIAGNOSIS — Z1231 Encounter for screening mammogram for malignant neoplasm of breast: Secondary | ICD-10-CM | POA: Diagnosis not present

## 2019-12-16 ENCOUNTER — Ambulatory Visit (INDEPENDENT_AMBULATORY_CARE_PROVIDER_SITE_OTHER): Payer: Medicare Other | Admitting: Podiatry

## 2019-12-16 ENCOUNTER — Encounter: Payer: Self-pay | Admitting: Podiatry

## 2019-12-16 ENCOUNTER — Other Ambulatory Visit: Payer: Self-pay

## 2019-12-16 DIAGNOSIS — Z79899 Other long term (current) drug therapy: Secondary | ICD-10-CM | POA: Diagnosis not present

## 2019-12-16 MED ORDER — TERBINAFINE HCL 250 MG PO TABS: 250.0000 mg | ORAL_TABLET | Freq: Every day | ORAL | 0 refills | Status: DC

## 2019-12-16 NOTE — Progress Notes (Signed)
She presents today for follow-up of her Lamisil therapy she is completed her first dose of 30 tablets 1 every other day and states that her toenails are nearly grown out.  He states that her ganglion cyst is coming out once again over the base of the fifth metatarsal area left foot.  Objective: Toenails are clearing very nicely at this point still has some residual onychomycosis of the hallux nail plate left.  No tinea pedis no interdigital macerations.  Small ganglion cyst nonpainful on palpation but firm and nonpulsatile less than a centimeter in diameter overlying the fifth metatarsal base left.  Assessment: Long-term therapy with use of Lamisil.  Ganglion cyst dorsal aspect left foot.  Plan: Discussed the need for aspiration which she did not do at this time.  Discussed surgical intervention as well.  We will continue with the Lamisil 1 tablet every other day I will follow-up with her in 3 months.

## 2019-12-23 HISTORY — PX: ENDOSCOPIC VEIN LASER TREATMENT: SHX1508

## 2020-01-07 ENCOUNTER — Other Ambulatory Visit: Payer: Self-pay

## 2020-01-07 ENCOUNTER — Encounter (INDEPENDENT_AMBULATORY_CARE_PROVIDER_SITE_OTHER): Payer: Self-pay | Admitting: Vascular Surgery

## 2020-01-07 ENCOUNTER — Ambulatory Visit (INDEPENDENT_AMBULATORY_CARE_PROVIDER_SITE_OTHER): Payer: Medicare Other | Admitting: Vascular Surgery

## 2020-01-07 VITALS — BP 149/67 | HR 59 | Resp 16 | Wt 161.0 lb

## 2020-01-07 DIAGNOSIS — I8311 Varicose veins of right lower extremity with inflammation: Secondary | ICD-10-CM

## 2020-01-08 ENCOUNTER — Other Ambulatory Visit (INDEPENDENT_AMBULATORY_CARE_PROVIDER_SITE_OTHER): Payer: Self-pay | Admitting: Vascular Surgery

## 2020-01-08 ENCOUNTER — Encounter (INDEPENDENT_AMBULATORY_CARE_PROVIDER_SITE_OTHER): Payer: Self-pay | Admitting: Vascular Surgery

## 2020-01-08 DIAGNOSIS — I83811 Varicose veins of right lower extremities with pain: Secondary | ICD-10-CM

## 2020-01-08 NOTE — Progress Notes (Signed)
    MRN : [id number]  Angela Walters is a 69 y.o. (Jul 06, 1950) female who presents with chief complaint of painful varicose veins.    The patient's right  lower extremity was sterilely prepped and draped.  The ultrasound machine was used to visualize the right great saphenous vein throughout its course.  A segment at the knee was selected for access.  The saphenous vein was accessed without difficulty using ultrasound guidance with a micropuncture needle.   An 0.018  wire was placed beyond the saphenofemoral junction through the sheath and the microneedle was removed.  The 65 cm sheath was then placed over the wire and the wire and dilator were removed.  The laser fiber was placed through the sheath and its tip was placed approximately 2 cm below the saphenofemoral junction.  Tumescent anesthesia was then created with a dilute lidocaine solution.  Laser energy was then delivered with constant withdrawal of the sheath and laser fiber.  Approximately 1580 Joules of energy were delivered over a length of 31 cm.  Sterile dressings were placed.  The patient tolerated the procedure well without complications.

## 2020-01-11 ENCOUNTER — Other Ambulatory Visit: Payer: Self-pay

## 2020-01-11 ENCOUNTER — Ambulatory Visit (INDEPENDENT_AMBULATORY_CARE_PROVIDER_SITE_OTHER): Payer: Medicare Other

## 2020-01-11 DIAGNOSIS — I83811 Varicose veins of right lower extremities with pain: Secondary | ICD-10-CM

## 2020-03-23 ENCOUNTER — Ambulatory Visit (INDEPENDENT_AMBULATORY_CARE_PROVIDER_SITE_OTHER): Payer: Medicare Other | Admitting: Podiatry

## 2020-03-23 ENCOUNTER — Encounter: Payer: Self-pay | Admitting: Podiatry

## 2020-03-23 ENCOUNTER — Other Ambulatory Visit: Payer: Self-pay

## 2020-03-23 DIAGNOSIS — Z79899 Other long term (current) drug therapy: Secondary | ICD-10-CM

## 2020-03-23 DIAGNOSIS — L603 Nail dystrophy: Secondary | ICD-10-CM | POA: Diagnosis not present

## 2020-03-23 MED ORDER — TERBINAFINE HCL 250 MG PO TABS: 250.0000 mg | ORAL_TABLET | Freq: Every day | ORAL | 0 refills | Status: DC

## 2020-03-23 NOTE — Progress Notes (Signed)
She presents today for follow-up of her onychomycosis that she has been taking Lamisil for.  She just completed her second every other day dose without complications.  States that it seems to be growing out and looking much better she is happy with the outcome thus far.  Objective: Vital signs are stable alert oriented x3 pulses are palpable hallux left demonstrates approximately 50% clearing of the hallux nail plate left the nail distally is still thick with an crumbly.  Assessment: Onychomycosis hallux left.  Plan: Long-term therapy with Lamisil 1 tablet every other day call with questions or concerns I will follow-up with her in 3 months we dispensed 30 tablets

## 2020-06-20 DIAGNOSIS — M7542 Impingement syndrome of left shoulder: Secondary | ICD-10-CM | POA: Insufficient documentation

## 2020-06-22 ENCOUNTER — Ambulatory Visit: Payer: Medicare Other | Admitting: Podiatry

## 2020-07-04 ENCOUNTER — Encounter: Payer: Self-pay | Admitting: Podiatry

## 2020-07-04 ENCOUNTER — Other Ambulatory Visit: Payer: Self-pay

## 2020-07-04 ENCOUNTER — Ambulatory Visit (INDEPENDENT_AMBULATORY_CARE_PROVIDER_SITE_OTHER): Payer: Medicare Other | Admitting: Podiatry

## 2020-07-04 DIAGNOSIS — L603 Nail dystrophy: Secondary | ICD-10-CM | POA: Diagnosis not present

## 2020-07-04 NOTE — Progress Notes (Signed)
She presents today for follow-up of her nail fungus.  She states that really seems to be doing well I think is all grown out.  Objective: She has some nail polish on the nails but from what I can see beneath the nail margin it appears to be that is 100% clear.  Assessment: Resolving onychomycosis.  Plan: Follow-up with me on an as-needed basis.

## 2020-10-25 ENCOUNTER — Other Ambulatory Visit: Payer: Self-pay | Admitting: Internal Medicine

## 2020-10-25 ENCOUNTER — Other Ambulatory Visit: Payer: Self-pay | Admitting: Obstetrics and Gynecology

## 2020-10-25 DIAGNOSIS — Z1231 Encounter for screening mammogram for malignant neoplasm of breast: Secondary | ICD-10-CM

## 2020-10-29 ENCOUNTER — Other Ambulatory Visit: Payer: Self-pay | Admitting: Orthopedic Surgery

## 2020-11-08 ENCOUNTER — Other Ambulatory Visit: Payer: Self-pay

## 2020-11-08 ENCOUNTER — Encounter
Admission: RE | Admit: 2020-11-08 | Discharge: 2020-11-08 | Disposition: A | Discharge status: Home / Self Care | Payer: Medicare Other | Source: Ambulatory Visit | Attending: Orthopedic Surgery | Admitting: Orthopedic Surgery

## 2020-11-08 HISTORY — DX: Dysplasia of cervix uteri, unspecified: N87.9

## 2020-11-08 HISTORY — DX: Hyperlipidemia, unspecified: E78.5

## 2020-11-08 HISTORY — DX: Essential (primary) hypertension: I10

## 2020-11-08 HISTORY — DX: Unspecified cataract: H26.9

## 2020-11-08 HISTORY — DX: Spondylolisthesis, lumbar region: M43.16

## 2020-11-08 NOTE — Patient Instructions (Addendum)
Your procedure is scheduled on: Thursday, July 21 Report to the Registration Desk on the 1st floor of the [facility] To find out your arrival time, please call [phone] between 1PM - 3PM on: Wednesday, July 20  REMEMBER: Instructions that are not followed completely may result in serious medical risk, up to and including death; or upon the discretion of your surgeon and anesthesiologist your surgery may need to be rescheduled.  Do not eat food after midnight the night before surgery.  No gum chewing, lozengers or hard candies.  You may however, drink CLEAR liquids up to 2 hours before you are scheduled to arrive for your surgery. Do not drink anything within 2 hours of your scheduled arrival time.  Clear liquids include: - water  - apple juice without pulp - gatorade (not RED, PURPLE, OR BLUE) - black coffee or tea (Do NOT add milk or creamers to the coffee or tea) Do NOT drink anything that is not on this list.  DO NOT TAKE ANY MEDICATIONS THE MORNING OF SURGERY  One week prior to surgery: Stop Anti-inflammatories (NSAIDS) such as Advil, Aleve, Ibuprofen, Motrin, Naproxen, Naprosyn and Aspirin based products such as Excedrin, Goodys Powder, BC Powder. Stop ANY AND ALL OVER THE COUNTER supplements until after surgery. You may however, continue to take Tylenol if needed for pain up until the day of surgery.  No Alcohol for 24 hours before or after surgery.  On the morning of surgery brush your teeth with toothpaste and water, you may rinse your mouth with mouthwash if you wish. Do not swallow any toothpaste or mouthwash.  Do not wear jewelry, make-up, hairpins, clips or nail polish.  Do not wear lotions, powders, or perfumes.   Do not shave body from the neck down 48 hours prior to surgery just in case you cut yourself which could leave a site for infection.  Also, freshly shaved skin may become irritated if using the CHG soap.  Contact lenses, hearing aids and dentures  may not be worn into surgery.  Do not bring valuables to the hospital. [facility] is not responsible for any missing/lost belongings or valuables.   Use CHG Soap as directed on instruction sheet.  Notify your doctor if there is any change in your medical condition (cold, fever, infection).  Wear comfortable clothing (specific to your surgery type) to the hospital.  After surgery, you can help prevent lung complications by doing breathing exercises.  Take deep breaths and cough every 1-2 hours. Your doctor may order a device called an Incentive Spirometer to help you take deep breaths.  If you are being discharged the day of surgery, you will not be allowed to drive home. You will need a responsible adult (18 years or older) to drive you home and stay with you that night.   If you are taking public transportation, you will need to have a responsible adult (18 years or older) with you. Please confirm with your physician that it is acceptable to use public transportation.   Please call the Pre-admissions Testing Dept. at [phone] if you have any questions about these instructions.  Surgery Visitation Policy:  Patients undergoing a surgery or procedure may have one family member or support person with them as long as that person is not COVID-19 positive or experiencing its symptoms.  That person may remain in the waiting area during the procedure.

## 2020-11-09 ENCOUNTER — Encounter
Admission: RE | Admit: 2020-11-09 | Discharge: 2020-11-09 | Disposition: A | Discharge status: Home / Self Care | Payer: Medicare Other | Source: Ambulatory Visit | Attending: Orthopedic Surgery | Admitting: Orthopedic Surgery

## 2020-11-09 DIAGNOSIS — I10 Essential (primary) hypertension: Secondary | ICD-10-CM | POA: Diagnosis not present

## 2020-11-09 DIAGNOSIS — Z01818 Encounter for other preprocedural examination: Secondary | ICD-10-CM | POA: Insufficient documentation

## 2020-11-09 LAB — APTT: aPTT: 28 seconds (ref 24–36)

## 2020-11-09 LAB — PROTIME-INR
INR: 1 (ref 0.8–1.2)
Prothrombin Time: 13.1 seconds (ref 11.4–15.2)

## 2020-11-09 MED ORDER — CHLORHEXIDINE GLUCONATE CLOTH 2 % EX PADS: 6.0000 | MEDICATED_PAD | Freq: Once | CUTANEOUS | Status: DC

## 2020-11-09 MED ORDER — ACETAMINOPHEN 500 MG PO TABS: 1000.0000 mg | ORAL_TABLET | ORAL | Status: AC

## 2020-11-09 MED ORDER — FAMOTIDINE 20 MG PO TABS: 20.0000 mg | ORAL_TABLET | Freq: Once | ORAL | Status: AC

## 2020-11-09 MED ORDER — CEFAZOLIN SODIUM-DEXTROSE 2-4 GM/100ML-% IV SOLN
2.0000 g | INTRAVENOUS | Status: AC
  Administered 2020-11-10: 2 g via INTRAVENOUS

## 2020-11-09 MED ORDER — CELECOXIB 200 MG PO CAPS: 200.0000 mg | ORAL_CAPSULE | ORAL | Status: AC

## 2020-11-09 MED ORDER — LACTATED RINGERS IV SOLN: INTRAVENOUS | Status: DC

## 2020-11-09 MED ORDER — CHLORHEXIDINE GLUCONATE 0.12 % MT SOLN
15.0000 mL | Freq: Once | OROMUCOSAL | Status: AC
Start: 2020-11-09 — End: 2020-11-10

## 2020-11-09 MED ORDER — ORAL CARE MOUTH RINSE: 15.0000 mL | Freq: Once | OROMUCOSAL | Status: AC

## 2020-11-10 ENCOUNTER — Encounter
Admission: RE | Disposition: A | Discharge status: Home / Self Care | Payer: Self-pay | Source: Home / Self Care | Attending: Orthopedic Surgery

## 2020-11-10 ENCOUNTER — Ambulatory Visit: Payer: Medicare Other | Admitting: Certified Registered Nurse Anesthetist

## 2020-11-10 ENCOUNTER — Ambulatory Visit
Admission: RE | Admit: 2020-11-10 | Discharge: 2020-11-10 | Disposition: A | Discharge status: Home / Self Care | Payer: Medicare Other | Attending: Orthopedic Surgery | Admitting: Orthopedic Surgery

## 2020-11-10 ENCOUNTER — Other Ambulatory Visit: Payer: Self-pay

## 2020-11-10 ENCOUNTER — Encounter: Payer: Self-pay | Admitting: Orthopedic Surgery

## 2020-11-10 DIAGNOSIS — X58XXXA Exposure to other specified factors, initial encounter: Secondary | ICD-10-CM | POA: Diagnosis not present

## 2020-11-10 DIAGNOSIS — M659 Synovitis and tenosynovitis, unspecified: Secondary | ICD-10-CM | POA: Diagnosis not present

## 2020-11-10 DIAGNOSIS — M2341 Loose body in knee, right knee: Secondary | ICD-10-CM | POA: Diagnosis not present

## 2020-11-10 DIAGNOSIS — M2241 Chondromalacia patellae, right knee: Secondary | ICD-10-CM | POA: Insufficient documentation

## 2020-11-10 DIAGNOSIS — M6751 Plica syndrome, right knee: Secondary | ICD-10-CM | POA: Insufficient documentation

## 2020-11-10 DIAGNOSIS — Z79899 Other long term (current) drug therapy: Secondary | ICD-10-CM | POA: Insufficient documentation

## 2020-11-10 DIAGNOSIS — Z791 Long term (current) use of non-steroidal anti-inflammatories (NSAID): Secondary | ICD-10-CM | POA: Diagnosis not present

## 2020-11-10 DIAGNOSIS — Z20822 Contact with and (suspected) exposure to covid-19: Secondary | ICD-10-CM | POA: Diagnosis not present

## 2020-11-10 DIAGNOSIS — S83271A Complex tear of lateral meniscus, current injury, right knee, initial encounter: Secondary | ICD-10-CM | POA: Insufficient documentation

## 2020-11-10 DIAGNOSIS — Z87891 Personal history of nicotine dependence: Secondary | ICD-10-CM | POA: Diagnosis not present

## 2020-11-10 DIAGNOSIS — S83281A Other tear of lateral meniscus, current injury, right knee, initial encounter: Secondary | ICD-10-CM | POA: Diagnosis present

## 2020-11-10 HISTORY — PX: KNEE ARTHROSCOPY WITH MENISCAL REPAIR: SHX5653

## 2020-11-10 LAB — SARS CORONAVIRUS 2 BY RT PCR (HOSPITAL ORDER, PERFORMED IN ~~LOC~~ HOSPITAL LAB): SARS Coronavirus 2: NEGATIVE

## 2020-11-10 SURGERY — ARTHROSCOPY, KNEE, WITH MENISCUS REPAIR
Anesthesia: General | Site: Knee | Laterality: Right

## 2020-11-10 MED ORDER — CELECOXIB 200 MG PO CAPS
ORAL_CAPSULE | ORAL | Status: AC
  Administered 2020-11-10: 200 mg via ORAL
  Filled 2020-11-10: qty 1

## 2020-11-10 MED ORDER — ONDANSETRON HCL 4 MG PO TABS: 4.0000 mg | ORAL_TABLET | Freq: Three times a day (TID) | ORAL | 0 refills | Status: DC | PRN

## 2020-11-10 MED ORDER — BUPIVACAINE-EPINEPHRINE (PF) 0.25% -1:200000 IJ SOLN
INTRAMUSCULAR | Status: DC | PRN
  Administered 2020-11-10: 30 mL

## 2020-11-10 MED ORDER — ACETAMINOPHEN 500 MG PO TABS
ORAL_TABLET | ORAL | Status: AC
  Administered 2020-11-10: 1000 mg via ORAL
  Filled 2020-11-10: qty 2

## 2020-11-10 MED ORDER — FAMOTIDINE 20 MG PO TABS
ORAL_TABLET | ORAL | Status: AC
  Administered 2020-11-10: 20 mg via ORAL
  Filled 2020-11-10: qty 1

## 2020-11-10 MED ORDER — BUPIVACAINE-EPINEPHRINE (PF) 0.25% -1:200000 IJ SOLN
INTRAMUSCULAR | Status: AC
  Filled 2020-11-10: qty 30

## 2020-11-10 MED ORDER — NEOSTIGMINE METHYLSULFATE 10 MG/10ML IV SOLN
INTRAVENOUS | Status: AC
  Filled 2020-11-10: qty 1

## 2020-11-10 MED ORDER — GLYCOPYRROLATE 0.2 MG/ML IJ SOLN
INTRAMUSCULAR | Status: DC | PRN
  Administered 2020-11-10: .2 mg via INTRAVENOUS

## 2020-11-10 MED ORDER — DEXAMETHASONE SODIUM PHOSPHATE 10 MG/ML IJ SOLN
INTRAMUSCULAR | Status: DC | PRN
  Administered 2020-11-10: 5 mg via INTRAVENOUS

## 2020-11-10 MED ORDER — FENTANYL CITRATE (PF) 100 MCG/2ML IJ SOLN
INTRAMUSCULAR | Status: AC
  Filled 2020-11-10: qty 2

## 2020-11-10 MED ORDER — LIDOCAINE HCL (PF) 1 % IJ SOLN
INTRAMUSCULAR | Status: AC
  Filled 2020-11-10: qty 30

## 2020-11-10 MED ORDER — LACTATED RINGERS IR SOLN
Status: DC | PRN
  Administered 2020-11-10: 12000 mL

## 2020-11-10 MED ORDER — HYDROCODONE-ACETAMINOPHEN 5-325 MG PO TABS: 1.0000 | ORAL_TABLET | ORAL | 0 refills | Status: DC | PRN

## 2020-11-10 MED ORDER — CEFAZOLIN SODIUM-DEXTROSE 2-4 GM/100ML-% IV SOLN
INTRAVENOUS | Status: AC
  Filled 2020-11-10: qty 100

## 2020-11-10 MED ORDER — ASPIRIN EC 325 MG PO TBEC: 325.0000 mg | DELAYED_RELEASE_TABLET | Freq: Every day | ORAL | 0 refills | Status: DC

## 2020-11-10 MED ORDER — FENTANYL CITRATE (PF) 100 MCG/2ML IJ SOLN
INTRAMUSCULAR | Status: AC
  Administered 2020-11-10: 25 ug via INTRAVENOUS
  Filled 2020-11-10: qty 2

## 2020-11-10 MED ORDER — ONDANSETRON HCL 4 MG/2ML IJ SOLN
INTRAMUSCULAR | Status: DC | PRN
  Administered 2020-11-10: 4 mg via INTRAVENOUS

## 2020-11-10 MED ORDER — LIDOCAINE HCL (CARDIAC) PF 100 MG/5ML IV SOSY
PREFILLED_SYRINGE | INTRAVENOUS | Status: DC | PRN
  Administered 2020-11-10: 80 mg via INTRAVENOUS

## 2020-11-10 MED ORDER — GLYCOPYRROLATE 0.2 MG/ML IJ SOLN
INTRAMUSCULAR | Status: AC
  Filled 2020-11-10: qty 1

## 2020-11-10 MED ORDER — FENTANYL CITRATE (PF) 100 MCG/2ML IJ SOLN
25.0000 ug | INTRAMUSCULAR | Status: DC | PRN
  Administered 2020-11-10: 50 ug via INTRAVENOUS
  Administered 2020-11-10: 25 ug via INTRAVENOUS

## 2020-11-10 MED ORDER — ONDANSETRON HCL 4 MG/2ML IJ SOLN: 4.0000 mg | Freq: Once | INTRAMUSCULAR | Status: DC | PRN

## 2020-11-10 MED ORDER — LIDOCAINE HCL (PF) 1 % IJ SOLN
INTRAMUSCULAR | Status: DC | PRN
  Administered 2020-11-10: 3 mL via INTRADERMAL

## 2020-11-10 MED ORDER — CHLORHEXIDINE GLUCONATE 0.12 % MT SOLN
OROMUCOSAL | Status: AC
  Administered 2020-11-10: 15 mL via OROMUCOSAL
  Filled 2020-11-10: qty 15

## 2020-11-10 MED ORDER — PROPOFOL 10 MG/ML IV BOLUS
INTRAVENOUS | Status: DC | PRN
  Administered 2020-11-10: 20 mg via INTRAVENOUS
  Administered 2020-11-10: 30 mg via INTRAVENOUS
  Administered 2020-11-10: 120 mg via INTRAVENOUS

## 2020-11-10 MED ORDER — FENTANYL CITRATE (PF) 100 MCG/2ML IJ SOLN
INTRAMUSCULAR | Status: DC | PRN
  Administered 2020-11-10: 75 ug via INTRAVENOUS
  Administered 2020-11-10 (×2): 25 ug via INTRAVENOUS

## 2020-11-10 SURGICAL SUPPLY — 41 items
ADAPTER IRRIG TUBE 2 SPIKE SOL (ADAPTER) ×4 IMPLANT
ADPR TBG 2 SPK PMP STRL ASCP (ADAPTER) ×2
BLADE FULL RADIUS 3.5 (BLADE) ×2 IMPLANT
BLADE SHAVER 4.5X7 STR FR (MISCELLANEOUS) ×2 IMPLANT
BUR BR 5.5 WIDE MOUTH (BURR) IMPLANT
COOLER POLAR GLACIER W/PUMP (MISCELLANEOUS) ×2 IMPLANT
CUFF TOURN SGL QUICK 24 (TOURNIQUET CUFF)
CUFF TOURN SGL QUICK 34 (TOURNIQUET CUFF) ×2
CUFF TRNQT CYL 24X4X16.5-23 (TOURNIQUET CUFF) IMPLANT
CUFF TRNQT CYL 34X4.125X (TOURNIQUET CUFF) ×1 IMPLANT
DRAPE IMP U-DRAPE 54X76 (DRAPES) ×2 IMPLANT
DURAPREP 26ML APPLICATOR (WOUND CARE) ×6 IMPLANT
GAUZE SPONGE 4X4 12PLY STRL (GAUZE/BANDAGES/DRESSINGS) ×2 IMPLANT
GAUZE XEROFORM 1X8 LF (GAUZE/BANDAGES/DRESSINGS) ×2 IMPLANT
GLOVE SURG ORTHO LTX SZ9 (GLOVE) ×4 IMPLANT
GLOVE SURG UNDER POLY LF SZ9 (GLOVE) ×2 IMPLANT
GOWN STRL REUS W/ TWL LRG LVL3 (GOWN DISPOSABLE) ×1 IMPLANT
GOWN STRL REUS W/TWL 2XL LVL3 (GOWN DISPOSABLE) ×2 IMPLANT
GOWN STRL REUS W/TWL LRG LVL3 (GOWN DISPOSABLE) ×2
IV LACTATED RINGER IRRG 3000ML (IV SOLUTION) ×8
IV LR IRRIG 3000ML ARTHROMATIC (IV SOLUTION) ×4 IMPLANT
KIT TURNOVER KIT A (KITS) ×2 IMPLANT
MANIFOLD NEPTUNE II (INSTRUMENTS) ×2 IMPLANT
MAT ABSORB  FLUID 56X50 GRAY (MISCELLANEOUS) ×1
MAT ABSORB FLUID 56X50 GRAY (MISCELLANEOUS) ×1 IMPLANT
NEEDLE HYPO 22GX1.5 SAFETY (NEEDLE) ×2 IMPLANT
PACK ARTHROSCOPY KNEE (MISCELLANEOUS) ×2 IMPLANT
PAD ABD DERMACEA PRESS 5X9 (GAUZE/BANDAGES/DRESSINGS) ×4 IMPLANT
PAD WRAPON POLAR KNEE (MISCELLANEOUS) ×1 IMPLANT
SHAVER BLADE TAPERED BLUNT 4 (BLADE) ×2 IMPLANT
SOL PREP PVP 2OZ (MISCELLANEOUS)
SOLUTION PREP PVP 2OZ (MISCELLANEOUS) IMPLANT
SPONGE T-LAP 18X18 ~~LOC~~+RFID (SPONGE) ×2 IMPLANT
STRIP CLOSURE SKIN 1/2X4 (GAUZE/BANDAGES/DRESSINGS) ×2 IMPLANT
SUT ETHILON 4-0 (SUTURE) ×2
SUT ETHILON 4-0 FS2 18XMFL BLK (SUTURE) ×1
SUTURE ETHLN 4-0 FS2 18XMF BLK (SUTURE) ×1 IMPLANT
TUBING INFLOW SET DBFLO PUMP (TUBING) ×2 IMPLANT
TUBING OUTFLOW SET DBLFO PUMP (TUBING) ×2 IMPLANT
WAND WEREWOLF FLOW 90D (MISCELLANEOUS) ×2 IMPLANT
WRAPON POLAR PAD KNEE (MISCELLANEOUS) ×2

## 2020-11-10 NOTE — Transfer of Care (Signed)
Immediate Anesthesia Transfer of Care Note  Patient: Angela Walters  Procedure(s) Performed: RIGHT KNEE ARTHROSCOPY WITH LATERAL MENISCECTOMY (Right: Knee)  Patient Location: PACU  Anesthesia Type:General  Level of Consciousness: awake, drowsy and patient cooperative  Airway & Oxygen Therapy: Patient Spontanous Breathing  Post-op Assessment: Report given to RN and Post -op Vital signs reviewed and stable  Post vital signs: Reviewed and stable  Last Vitals:  Vitals Value Taken Time  BP 145/63 11/10/20 1415  Temp    Pulse 73 11/10/20 1418  Resp 11 11/10/20 1418  SpO2 100 % 11/10/20 1418  Vitals shown include unvalidated device data.  Last Pain:  Vitals:   11/10/20 1124  TempSrc: Temporal  PainSc: 2       Patients Stated Pain Goal: 0 (11/10/20 1124)  Complications: No notable events documented.

## 2020-11-10 NOTE — Op Note (Addendum)
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  PROCEDURE: Right knee arthroscopic partial lateral meniscectomy, chondroplasty of the lateral femoral condyle, lateral tibial plateau, undersurface of the patella with limited synovectomy, excision of medial plica and removal of loose body.  SURGEON:  Thornton Park, MD  ANESTHESIA:   General  PREOPERATIVE INDICATIONS:  Angela Walters  70 y.o. female with a diagnosis of left knee tear of the lateral meniscus with lateral compartment chondromalacia who failed conservative management and elected for surgical management.    The risks benefits and alternatives were discussed with the patient preoperatively including the risks of infection, bleeding, nerve injury, knee stiffness, persistent pain, osteoarthritis and the need for further surgery. Medical  risks include DVT and pulmonary embolism, myocardial infarction, stroke, pneumonia, respiratory failure and death. The patient understood these risks and wished to proceed.  OPERATIVE FINDINGS: Right knee complex lateral meniscus tear with incomplete radial component near the posterior root, grade 4 chondral lesion of the medial femoral condyle, grade II/III chondromalacia of the lateral tibial plateau and undersurface of the patella.  Extensive synovitis and medial plica.  Patient had large chondral loose body in the anterolateral knee.    OPERATIVE PROCEDURE: Patient was met in the preoperative area. The operative extremity was signed with the word yes and my initials according the hospital's correct site of surgery protocol.  The patient was brought to the operating room where they was placed supine on the operative table. General anesthesia was  administered. The patient was prepped and draped in a sterile fashion.  A timeout was performed to verify the patient's name, date of birth, medical record number, correct site of surgery correct procedure to be performed. It was also used to verify the patient received antibiotics that all appropriate instruments, and radiographic studies were available in the room. Once all in attendance were in agreement, the case began.  Proposed arthroscopy incisions were drawn out with a surgical marker. These were pre-injected with 1% lidocaine plain. An 11 blade was used to establish an inferior lateral and inferomedial portals. The inferomedial portal was created using a 18-gauge spinal needle under direct visualization.  A full diagnostic examination of the knee was performed including the suprapatellar pouch, patellofemoral joint, medial lateral compartments as well as the medial lateral gutters, the intercondylar notch in the posterior knee.  Findings on arthroscopy are noted above.  Patient underwent a limited synovectomy and debridement of Hoffa's fat pad and medial plica with a 4.0 resector shaver blade and 90 degree wand.  Patient had the medial compartment examined and had no focal chondral injuries or medial meniscus tear.  The ACL was intact.  A large chondral loose body was encountered in the anterior lateral knee near the intercondylar notch.  This was removed with a pituitary grasper.  It measured over 1 cm.  The patient's leg was then maneuvered into a figure-of-four position.  She was found to have a complex tear involving the lateral meniscus body and posterior horn with an incomplete radial component near the posterior root. The meniscal tear treated with a 4-0 resector shaver blade and straight duckbill basket. The meniscus was debrided until a stable rim was achieved. A chondroplasty of the lateral femoral condyle, lateral tibial plateau and undersurface of patella was also performed using a  4.0 resector shaver blade. A partial synovectomy was also performed using a 4-0 resector shaver blade and 90 ArthroCare wand.  The knee was then copiously lavaged. All arthroscopic instruments were removed. The 2 arthroscopy portals were closed with 4-0 nylon.  0.25% Marcaine plain x20 cc was injected intra-articularly to assist with postoperative pain control.  Steri-Strips were applied along with a dry sterile and compressive dressing and a Polar Care sleeve. The patient was brought to the PACU in stable condition. I was scrubbed and present for the entire case and all sharp and instrument counts were correct at the conclusion the case. I left a message with the patient's daughter postoperatively to let her know the case was performed without complication and the patient was stable in the recovery room.    Timoteo Gaul, MD

## 2020-11-10 NOTE — Anesthesia Postprocedure Evaluation (Signed)
Anesthesia Post Note  Patient: Angela Walters  Procedure(s) Performed: RIGHT KNEE ARTHROSCOPY WITH LATERAL MENISCECTOMY (Right: Knee)  Patient location during evaluation: PACU Anesthesia Type: General Level of consciousness: awake and alert Pain management: pain level controlled Vital Signs Assessment: post-procedure vital signs reviewed and stable Respiratory status: spontaneous breathing, nonlabored ventilation, respiratory function stable and patient connected to nasal cannula oxygen Cardiovascular status: blood pressure returned to baseline and stable Postop Assessment: no apparent nausea or vomiting Anesthetic complications: no   No notable events documented.   Last Vitals:  Vitals:   11/10/20 1500 11/10/20 1511  BP: (!) 150/57 (!) 157/48  Pulse: 67 (!) 56  Resp: (!) 22   Temp:  (!) 36.4 C  SpO2: 94% 98%    Last Pain:  Vitals:   11/10/20 1511  TempSrc: Temporal  PainSc: 0-No pain                 Lenard Simmer

## 2020-11-10 NOTE — Anesthesia Procedure Notes (Addendum)
Procedure Name: LMA Insertion Date/Time: 11/10/2020 12:55 PM Performed by: Henrietta Hoover, CRNA Pre-anesthesia Checklist: Patient identified, Emergency Drugs available, Suction available and Patient being monitored Patient Re-evaluated:Patient Re-evaluated prior to induction Oxygen Delivery Method: Circle system utilized Preoxygenation: Pre-oxygenation with 100% oxygen Induction Type: IV induction Ventilation: Mask ventilation without difficulty LMA: LMA inserted LMA Size: 4.5 Number of attempts: 1 Placement Confirmation: positive ETCO2 and breath sounds checked- equal and bilateral Tube secured with: Tape Dental Injury: Teeth and Oropharynx as per pre-operative assessment

## 2020-11-10 NOTE — H&P (Signed)
PREOPERATIVE H&P  Chief Complaint: Right Knee Lateral Meniscus Tear  HPI: Angela Walters is a 70 y.o. female who presents for preoperative history and physical with a diagnosis of Right Knee Lateral Meniscus Tear with concomitant lateral compartment chondromalacia. Symptoms of pain, swelling and mechanical symptoms are significantly impairing activities of daily living.  Patient has failed nonoperative management wished to proceed with arthroscopic partial lateral meniscectomy.   Past Medical History:  Diagnosis Date   Arthritis    Cataract    Cervical dysplasia    Essential hypertension    Frequent urination    Frequent urination at night    Hyperlipidemia    Spondylolisthesis of lumbar region    Wears hearing aid in both ears    Past Surgical History:  Procedure Laterality Date   ABDOMINAL HYSTERECTOMY     Buninectomy Right 2005   CATARACT EXTRACTION Left 2012   CATARACT EXTRACTION W/PHACO Right 09/08/2019   Procedure: CATARACT EXTRACTION PHACO AND INTRAOCULAR LENS PLACEMENT (IOC) RIGHT 7.44  00:53.6;  Surgeon: Galen Manila, MD;  Location: [facility];  Service: Ophthalmology;  Laterality: Right;   COLONOSCOPY     Conezation  1983   ENDOSCOPIC VEIN LASER TREATMENT Right 12/2019   EYE SURGERY     HYSTERECTOMY ABDOMINAL WITH SALPINGECTOMY  2000   LUMBAR FUSION  2017   L3-4-5   removal milk duct Left 1985   Social History   Socioeconomic History   Marital status: Widowed    Spouse name: Not on file   Number of children: 3   Years of education: Not on file   Highest education level: Not on file  Occupational History   Not on file  Tobacco Use   Smoking status: Former    Years: 10.00    Types: Cigarettes    Quit date: 1980    Years since quitting: 42.5   Smokeless tobacco: Never   Tobacco comments:    quit at age 89  Vaping Use   Vaping Use: Never used  Substance and Sexual Activity   Alcohol use: Yes    Alcohol/week: 2.0 standard drinks    Types: 2  Glasses of wine per week    Comment: occassional wine   Drug use: No   Sexual activity: Not on file  Other Topics Concern   Not on file  Social History Narrative   Not on file   Social Determinants of Health   Financial Resource Strain: Not on file  Food Insecurity: Not on file  Transportation Needs: Not on file  Physical Activity: Not on file  Stress: Not on file  Social Connections: Not on file   Family History  Problem Relation Age of Onset   Breast cancer Neg Hx    No Known Allergies Prior to Admission medications   Medication Sig Start Date End Date Taking? Authorizing Provider  acetaminophen (TYLENOL) 500 MG tablet Take 1,000 mg by mouth every 6 (six) hours as needed for moderate pain or headache.   Yes [provider]  Black Pepper-Turmeric (TURMERIC COMPLEX/BLACK PEPPER PO) Take 1 tablet by mouth daily.   Yes [provider]  Calcium Carb-Cholecalciferol (CALCIUM PLUS VITAMIN D3) 600-500 MG-UNIT CAPS Take 1 tablet by mouth daily.   Yes [provider]  Cholecalciferol (VITAMIN D-3) 25 MCG (1000 UT) CAPS Take 1,000 Units by mouth daily.   Yes [provider]  diclofenac Sodium (VOLTAREN) 1 % GEL Apply 1 application topically 4 (four) times daily as needed (pain).  Yes [provider]  diphenhydramine-acetaminophen (TYLENOL PM) 25-500 MG TABS tablet Take 1 tablet by mouth at bedtime as needed (sleep).   Yes [provider]  fluticasone (FLONASE) 50 MCG/ACT nasal spray Place 2 sprays into both nostrils daily as needed for allergies or rhinitis.   Yes [provider]  Ginkgo Biloba 60 MG CAPS Take 60 mg by mouth daily.   Yes [provider]  Homeopathic Products (LEG CRAMP COMPLEX PO) Take 1 tablet by mouth daily.   Yes [provider]  magnesium oxide (MAG-OX) 400 MG tablet Take 400 mg by mouth daily.   Yes [provider]  Omega-3 Fatty Acids (FISH OIL ULTRA) 1400 MG CAPS Take 1,400 mg  by mouth daily.   Yes [provider]  Potassium 99 MG TABS Take 99 mg by mouth daily.   Yes [provider]  Rhubarb (ESTROVEN COMPLETE PO) Take 1 tablet by mouth daily.   Yes [provider]  vitamin C (ASCORBIC ACID) 250 MG tablet Take 250 mg by mouth daily.   Yes [provider]  Zinc 50 MG TABS Take 50 mg by mouth daily.   Yes [provider]     Positive ROS: All other systems have been reviewed and were otherwise negative with the exception of those mentioned in the HPI and as above.  Physical Exam: General: Alert, no acute distress Cardiovascular: Regular rate and rhythm, no murmurs rubs or gallops.  No pedal edema Respiratory: Clear to auscultation bilaterally, no wheezes rales or rhonchi. No cyanosis, no use of accessory musculature GI: No organomegaly, abdomen is soft and non-tender nondistended with positive bowel sounds. Skin: Skin intact, no lesions within the operative field. Neurologic: Sensation intact distally Psychiatric: Patient is competent for consent with normal mood and affect Lymphatic: No cervical lymphadenopathy  MUSCULOSKELETAL: Right knee: Patient has point tenderness along the lateral joint line.  She has pain with McMurray's testing laterally.  Range of motion from 0 to 120 degrees.  She has no ligamentous laxity.  There is no erythema ecchymosis or effusion and distally she is neurovascular intact.  Assessment: Right Knee Lateral Meniscus Tear and lateral compartment chondromalacia  Plan: Plan for Procedure(s): RIGHT KNEE ARTHROSCOPY WITH PARTIAL LATERAL MENISCUS REPAIR  I reviewed the details of the operation as well as the postoperative course with the patient in my office prior to the date of surgery.  Today the right knee was marked according the hospital's correct site of surgery protocol.  An H&P was performed at the bedside.  I answered all the patient's questions.  I discussed the risks and benefits of  surgery. The risks include but are not limited to infection, bleeding , nerve or blood vessel injury, joint stiffness or loss of motion, persistent pain, weakness or instability, retear of the meniscus, progression of osteoarthritis and the need for further surgery. Medical risks include but are not limited to DVT and pulmonary embolism, myocardial infarction, stroke, pneumonia, respiratory failure and death. Patient understood these risks and wished to proceed.     Juanell Fairly, MD   11/10/2020 12:46 PM

## 2020-11-10 NOTE — Discharge Instructions (Signed)

## 2020-11-10 NOTE — Anesthesia Preprocedure Evaluation (Signed)
Anesthesia Evaluation  Patient identified by MRN, date of birth, ID band Patient awake    Reviewed: Allergy & Precautions, H&P , NPO status , Patient's Chart, lab work & pertinent test results, reviewed documented beta blocker date and time   History of Anesthesia Complications Negative for: history of anesthetic complications  Airway Mallampati: I  TM Distance: >3 FB Neck ROM: full    Dental  (+) Caps, Dental Advidsory Given, Teeth Intact   Pulmonary neg pulmonary ROS, former smoker,    Pulmonary exam normal breath sounds clear to auscultation       Cardiovascular Exercise Tolerance: Good negative cardio ROS Normal cardiovascular exam Rhythm:regular Rate:Normal     Neuro/Psych negative neurological ROS  negative psych ROS   GI/Hepatic negative GI ROS, Neg liver ROS,   Endo/Other  negative endocrine ROS  Renal/GU negative Renal ROS  negative genitourinary   Musculoskeletal   Abdominal   Peds  Hematology negative hematology ROS (+)   Anesthesia Other Findings Past Medical History: No date: Arthritis No date: Cataract No date: Cervical dysplasia No date: Essential hypertension No date: Frequent urination No date: Frequent urination at night No date: Hyperlipidemia No date: Spondylolisthesis of lumbar region No date: Wears hearing aid in both ears   Reproductive/Obstetrics negative OB ROS                             Anesthesia Physical Anesthesia Plan  ASA: 2  Anesthesia Plan: General   Post-op Pain Management:    Induction: Intravenous  PONV Risk Score and Plan: 3 and Ondansetron, Dexamethasone and Treatment may vary due to age or medical condition  Airway Management Planned: LMA  Additional Equipment:   Intra-op Plan:   Post-operative Plan: Extubation in OR  Informed Consent: I have reviewed the patients History and Physical, chart, labs and discussed the procedure  including the risks, benefits and alternatives for the proposed anesthesia with the patient or authorized representative who has indicated his/her understanding and acceptance.     Dental Advisory Given  Plan Discussed with: Anesthesiologist, CRNA and Surgeon  Anesthesia Plan Comments:         Anesthesia Quick Evaluation

## 2020-11-11 ENCOUNTER — Encounter: Payer: Self-pay | Admitting: Orthopedic Surgery

## 2020-11-18 DIAGNOSIS — M25561 Pain in right knee: Secondary | ICD-10-CM | POA: Insufficient documentation

## 2020-11-18 DIAGNOSIS — M25661 Stiffness of right knee, not elsewhere classified: Secondary | ICD-10-CM | POA: Insufficient documentation

## 2020-12-15 ENCOUNTER — Other Ambulatory Visit: Payer: Self-pay

## 2020-12-15 ENCOUNTER — Ambulatory Visit
Admission: RE | Admit: 2020-12-15 | Discharge: 2020-12-15 | Disposition: A | Discharge status: Home / Self Care | Payer: Medicare Other | Source: Ambulatory Visit | Attending: Internal Medicine | Admitting: Internal Medicine

## 2020-12-15 DIAGNOSIS — Z1231 Encounter for screening mammogram for malignant neoplasm of breast: Secondary | ICD-10-CM | POA: Insufficient documentation

## 2021-05-10 ENCOUNTER — Other Ambulatory Visit: Payer: Self-pay

## 2021-05-10 ENCOUNTER — Ambulatory Visit (INDEPENDENT_AMBULATORY_CARE_PROVIDER_SITE_OTHER): Payer: Medicare Other | Admitting: Podiatry

## 2021-05-10 ENCOUNTER — Ambulatory Visit (INDEPENDENT_AMBULATORY_CARE_PROVIDER_SITE_OTHER): Payer: Medicare Other

## 2021-05-10 ENCOUNTER — Other Ambulatory Visit: Payer: Self-pay | Admitting: Podiatry

## 2021-05-10 ENCOUNTER — Encounter: Payer: Self-pay | Admitting: Podiatry

## 2021-05-10 DIAGNOSIS — M67472 Ganglion, left ankle and foot: Secondary | ICD-10-CM

## 2021-05-10 DIAGNOSIS — M76821 Posterior tibial tendinitis, right leg: Secondary | ICD-10-CM

## 2021-05-10 DIAGNOSIS — M778 Other enthesopathies, not elsewhere classified: Secondary | ICD-10-CM

## 2021-05-10 MED ORDER — TRIAMCINOLONE ACETONIDE 40 MG/ML IJ SUSP
20.0000 mg | Freq: Once | INTRAMUSCULAR | Status: AC
  Administered 2021-05-10: 20 mg

## 2021-05-10 NOTE — Progress Notes (Signed)
She presents today chief complaint of pain and discomfort to the medial aspect of her right foot.  Denies any trauma.  States has been going on for the past 6 to 8 months started on a intermittent basis.  States that she sometimes has it at night but particularly when she is driving.  She is also complaining of a painful cyst overlying the dorsal lateral aspect of the left foot.  Radiographs taken today demonstrate soft tissue cyst overlying the dorsal lateral aspect fourth fifth tarsometatarsal joint area right foot does not demonstrate any significant abnormalities in this area.  Assessment: Posterior tibial tendinitis tibialis anterior tendinitis right.  Also some early osteoarthritic change possible.  Talonavicular joint.  Left foot does demonstrate a ganglion cyst which we aspirated 2-1/2 cc today after local anesthetic was administered tolerated procedure well without complications.  Follow-up with her in a month for surgical consult as long as this is feeling filling back up.

## 2021-06-14 ENCOUNTER — Encounter: Payer: Self-pay | Admitting: Podiatry

## 2021-06-14 ENCOUNTER — Ambulatory Visit (INDEPENDENT_AMBULATORY_CARE_PROVIDER_SITE_OTHER): Payer: Medicare Other | Admitting: Podiatry

## 2021-06-14 ENCOUNTER — Other Ambulatory Visit: Payer: Self-pay

## 2021-06-14 DIAGNOSIS — M76821 Posterior tibial tendinitis, right leg: Secondary | ICD-10-CM | POA: Diagnosis not present

## 2021-06-14 DIAGNOSIS — L603 Nail dystrophy: Secondary | ICD-10-CM

## 2021-06-14 DIAGNOSIS — M67472 Ganglion, left ankle and foot: Secondary | ICD-10-CM

## 2021-06-14 MED ORDER — TERBINAFINE HCL 250 MG PO TABS: 250.0000 mg | ORAL_TABLET | Freq: Every day | ORAL | 0 refills | Status: DC

## 2021-06-14 NOTE — Progress Notes (Signed)
She presents today for follow-up of her ganglion cyst laterally.  She states that is really not tender any longer has not given any problems.  She is questioning whether or not she has a recurrence of her fungus that we had treated for several months previously to the hallux left.  Objective: Vital signs are stable alert oriented x3.  Pulses are palpable.  Ganglion cyst appears to be stable left foot overlying the fifth metatarsal base.  She also has a thickening of her hallux nail left which does demonstrate onychomycosis.  I do think that this is a reoccurrence and I feel that most likely we need to retreat.  Assessment: Onychomycosis hallux left.  Ganglion cyst left.  Plan: Discussed etiology pathology and surgical therapies at this point were going to continue to monitor the ganglion cyst.  We will also start her on 30 days of Lamisil 1 tablet daily.  She has had blood work that her for yearly physical and had no problems with BUN/creatinine or AST and ALT.  So at this point we do not hesitate to provide her with this medication.  We once again again discussed the pros and cons of the use of this medication.  I will follow-up with her in 2 months at which time we may just put her on an every other day dose.

## 2021-08-14 ENCOUNTER — Ambulatory Visit (INDEPENDENT_AMBULATORY_CARE_PROVIDER_SITE_OTHER): Payer: Medicare Other | Admitting: Podiatry

## 2021-08-14 ENCOUNTER — Encounter: Payer: Self-pay | Admitting: Podiatry

## 2021-08-14 DIAGNOSIS — M67472 Ganglion, left ankle and foot: Secondary | ICD-10-CM | POA: Diagnosis not present

## 2021-08-14 DIAGNOSIS — L603 Nail dystrophy: Secondary | ICD-10-CM

## 2021-08-14 MED ORDER — TERBINAFINE HCL 250 MG PO TABS: 250.0000 mg | ORAL_TABLET | Freq: Every day | ORAL | 1 refills | Status: DC

## 2021-08-14 NOTE — Progress Notes (Signed)
She presents today states that she is doing good with the Lamisil and states that her toenails are clearing.  She has a ganglion cyst on the left foot that she would like to get rid of if possible. ? ?Objective: Vital signs are stable she is alert and oriented x3 she denies fever chills nausea vomit muscle aches pains calf pain back pain chest pain shortness of breath nausea vomiting rash or itching. ? ?Pulses remain strong and palpable.  Hallux left appears to be thinning and resolving its mycotic infection.  Also the ganglion cyst on the left foot is becoming larger and more lobulated and is beginning to dissect more tissue. ? ?Assessment: Pain in limb secondary to onychomycosis.  Long-term therapy with Lamisil. ? ?Ganglion cyst left foot. ? ?Plan: Discussed etiology pathology conservative versus surgical therapies.  At this point consented her today for excision ganglion cyst tumor left foot.  She understands this is amenable to it understands that there is a chance of recurrence.  We did discuss in great detail today the possible side effects which may include but are not limited to postop pain bleeding swell infection recurrence need for further surgery overcorrection under correction loss of digit loss of limb loss of life.  She signed all 3 pages of consent form we will do this at the [facility] surgical center in the near future. ? ?I dispensed another round of Lamisil and I will follow-up with her after surgery. ?

## 2021-09-06 ENCOUNTER — Other Ambulatory Visit: Payer: Self-pay | Admitting: Podiatry

## 2021-09-06 MED ORDER — ONDANSETRON HCL 4 MG PO TABS: 4.0000 mg | ORAL_TABLET | Freq: Three times a day (TID) | ORAL | 0 refills | Status: DC | PRN

## 2021-09-06 MED ORDER — HYDROCODONE-ACETAMINOPHEN 10-325 MG PO TABS: 1.0000 | ORAL_TABLET | Freq: Four times a day (QID) | ORAL | 0 refills | Status: AC | PRN

## 2021-09-06 MED ORDER — CEPHALEXIN 500 MG PO CAPS: 500.0000 mg | ORAL_CAPSULE | Freq: Three times a day (TID) | ORAL | 0 refills | Status: DC

## 2021-09-08 DIAGNOSIS — M67472 Ganglion, left ankle and foot: Secondary | ICD-10-CM | POA: Insufficient documentation

## 2021-09-13 ENCOUNTER — Encounter: Payer: Self-pay | Admitting: Podiatry

## 2021-09-13 ENCOUNTER — Ambulatory Visit (INDEPENDENT_AMBULATORY_CARE_PROVIDER_SITE_OTHER): Payer: Medicare Other | Admitting: Podiatry

## 2021-09-13 DIAGNOSIS — Z9889 Other specified postprocedural states: Secondary | ICD-10-CM

## 2021-09-13 DIAGNOSIS — M67472 Ganglion, left ankle and foot: Secondary | ICD-10-CM

## 2021-09-13 NOTE — Progress Notes (Signed)
She presents today postop visit #1 date of surgery 09/08/2021 excision ganglion left foot.  She states that is felt okay have only taken 1-1/2 of my pain pills.  Continue to take the antibiotic.  She states that she feels like she needs to get up and walk because her Fitbit bit tells her to do so.  She states her Fitbit tells her that she needs at least 250 steps several times a day.  She also goes on to say that she is riding her stationary bike on a daily basis.  States that she ices the foot about every other hour.  Objective: Vital signs are stable she is alert and oriented x3.  Pulses are palpable dry sterile dressing was removed demonstrating moderate erythema no cellulitis drainage or odor but there is some eschar on the scar with the incisions intact.  More than likely the erythema and the compression lines are due to swelling possibly due to overactivity.  But I see no signs of infection.  Assessment: 1 week status post excision ganglion left foot.  Plan: Redressed today dressed a compressive dressing but the dressing a little bit lighter than I had in the operating room but I did instruct her to keep this elevated and make sure that she does not do too much walking or riding her bike.

## 2021-09-20 ENCOUNTER — Encounter: Payer: Medicare Other | Admitting: Podiatry

## 2021-09-20 ENCOUNTER — Encounter: Payer: Self-pay | Admitting: Podiatry

## 2021-09-20 ENCOUNTER — Ambulatory Visit (INDEPENDENT_AMBULATORY_CARE_PROVIDER_SITE_OTHER): Payer: Medicare Other | Admitting: Podiatry

## 2021-09-20 DIAGNOSIS — M67472 Ganglion, left ankle and foot: Secondary | ICD-10-CM

## 2021-09-20 DIAGNOSIS — Z9889 Other specified postprocedural states: Secondary | ICD-10-CM

## 2021-09-20 NOTE — Progress Notes (Signed)
She presents today for her postop visit date of surgery 09/08/2021 excision ganglion cyst left foot.  She states some little twinges but I been having to stay off of it more since is been raining and I been reading and doing a lot more in the house.  Denies fever chills nausea vomiting muscle aches pains calf pain back pain chest pain shortness of breath states that she finished her antibiotics but has not been taking any of the pain medication.  Objective: Vital signs are stable alert and x3 dressed her dressing intact was removed demonstrates considerable amount of bleeding and serosanguineous oozing through a central area within the incision line.  Rest of it appears to be healing with exception of this 1 small area.  There is no signs of infection and that there is no redness no purulence when I mash on it no expression of any type of fluids.  Assessment: Well-healing surgical foot.  Plan: Redressed today dressed a compressive dressing for 1 more week continue to stay off of it keep it elevated do not get it wet we hope to be able to take the stitches out of it next week.

## 2021-09-27 ENCOUNTER — Encounter: Payer: Self-pay | Admitting: Podiatry

## 2021-09-27 ENCOUNTER — Ambulatory Visit (INDEPENDENT_AMBULATORY_CARE_PROVIDER_SITE_OTHER): Payer: Medicare Other | Admitting: Podiatry

## 2021-09-27 DIAGNOSIS — Z9889 Other specified postprocedural states: Secondary | ICD-10-CM

## 2021-09-27 DIAGNOSIS — M67472 Ganglion, left ankle and foot: Secondary | ICD-10-CM

## 2021-09-27 NOTE — Progress Notes (Signed)
She presents today date of surgery 09/08/2021 excision ganglion cyst left foot.  She states is feeling okay feels a little numb right and here she touches the incision site where the stitches are.  Objective: Vital signs are stable she is alert and oriented x3 there is no erythema just some mild edema no cellulitis drainage or odor she has some dry eschar that is present and scab that is present.  Sutures were removed today margins remain well coapted no signs of infection.  Assessment: Well-healing excision ganglion dorsal lateral left foot.  Plan: Removal of sutures today and placed in a compression dressing instructed her to stay in her Darco shoe for the next couple weeks and tried to transition to regular shoe gear.

## 2021-10-04 ENCOUNTER — Encounter: Payer: Medicare Other | Admitting: Podiatry

## 2021-10-11 ENCOUNTER — Encounter: Payer: Self-pay | Admitting: Podiatry

## 2021-10-11 ENCOUNTER — Ambulatory Visit (INDEPENDENT_AMBULATORY_CARE_PROVIDER_SITE_OTHER): Payer: Medicare Other | Admitting: Podiatry

## 2021-10-11 DIAGNOSIS — M67472 Ganglion, left ankle and foot: Secondary | ICD-10-CM

## 2021-10-11 DIAGNOSIS — Z9889 Other specified postprocedural states: Secondary | ICD-10-CM

## 2021-10-11 NOTE — Progress Notes (Signed)
Angela Walters presents today for postop visit date of surgery 09/08/2021 excision ganglion cyst left foot.  She states that is still doing very well has a little bit of numbness left but states that she went swimming the other day with her grandson and a large amount of the skin sloughed off.  Objective: Vital signs are stable she is alert and oriented x3 the incision line is going on to heal uneventfully without concern.  She does have some scar scarring of the area that has scarred down deep and I encouraged her to massage this to help break it up but otherwise is doing quite well.  Sensation is just slightly diminished along the sural distribution distal to that incision otherwise no complications.  Assessment: Well-healing surgical foot.  Plan: Encouraged her to continue to massage the area on a regular basis to help break down scar tissue she will follow-up with me on an as-needed basis.  Also encouraged her to wear shoe gear that does not interfere with eschar or put a lot of pressure on it.

## 2021-10-18 ENCOUNTER — Encounter: Payer: Medicare Other | Admitting: Podiatry

## 2021-10-20 ENCOUNTER — Other Ambulatory Visit: Payer: Self-pay | Admitting: Internal Medicine

## 2021-10-20 DIAGNOSIS — Z1231 Encounter for screening mammogram for malignant neoplasm of breast: Secondary | ICD-10-CM

## 2021-12-18 ENCOUNTER — Ambulatory Visit
Admission: RE | Admit: 2021-12-18 | Discharge: 2021-12-18 | Disposition: A | Discharge status: Home / Self Care | Payer: Medicare Other | Source: Ambulatory Visit | Attending: Internal Medicine | Admitting: Internal Medicine

## 2021-12-18 DIAGNOSIS — Z1231 Encounter for screening mammogram for malignant neoplasm of breast: Secondary | ICD-10-CM | POA: Diagnosis present

## 2022-02-19 ENCOUNTER — Encounter (INDEPENDENT_AMBULATORY_CARE_PROVIDER_SITE_OTHER): Payer: Self-pay

## 2022-08-22 IMAGING — MG DIGITAL SCREENING BILAT W/ TOMO W/ CAD
8 series · 8 of 24 positions shown · non-contrast
Comparison: Previous exam(s).

CLINICAL DATA: Screening.

EXAM:
DIGITAL SCREENING BILATERAL MAMMOGRAM WITH TOMO AND CAD

[R MLO synth-2D]
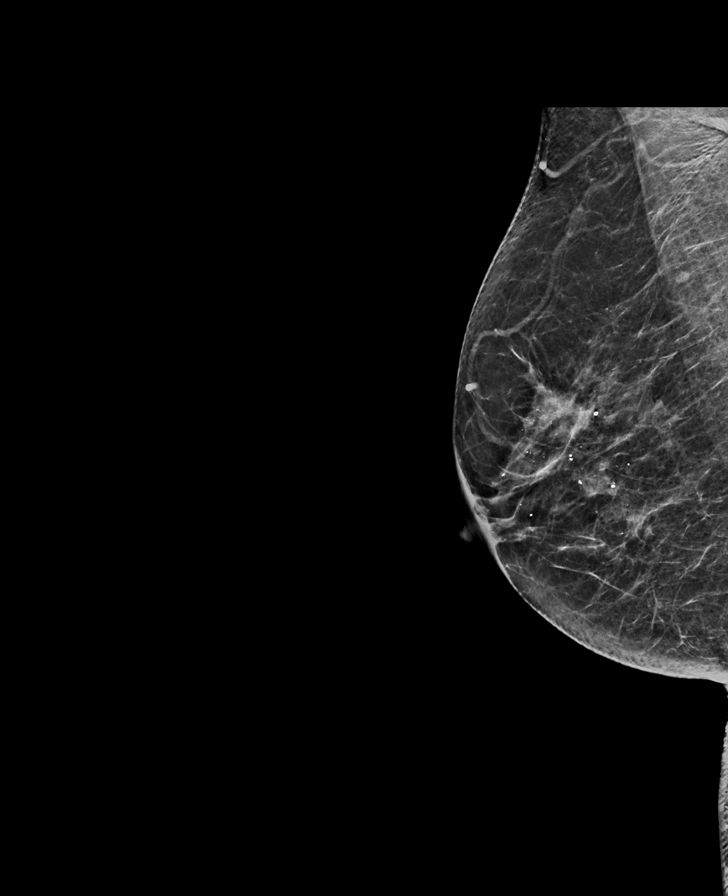

[L CC synth-2D]
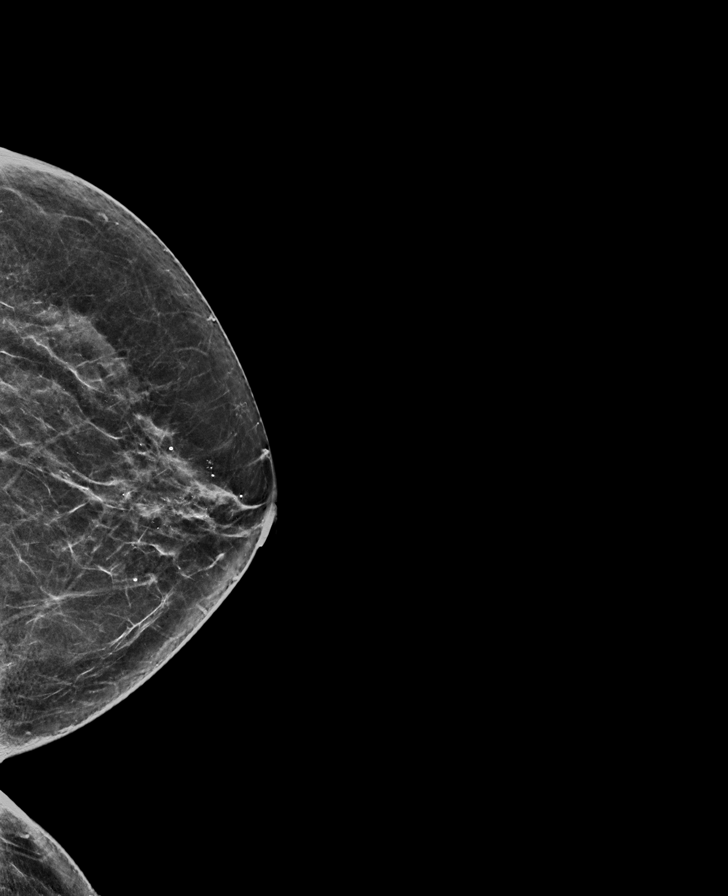

[R CC synth-2D]
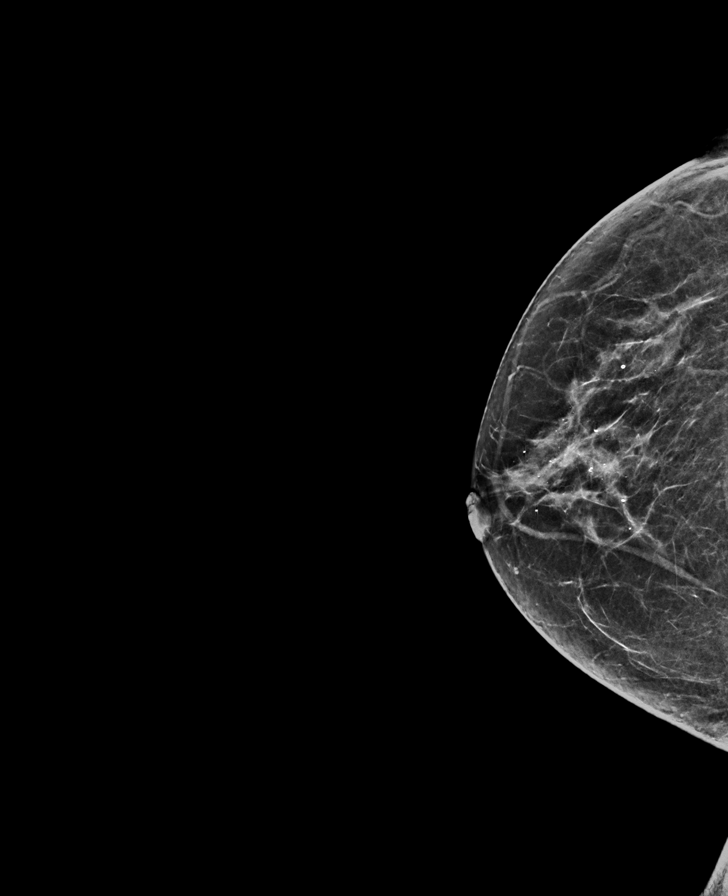

[L MLO synth-2D]
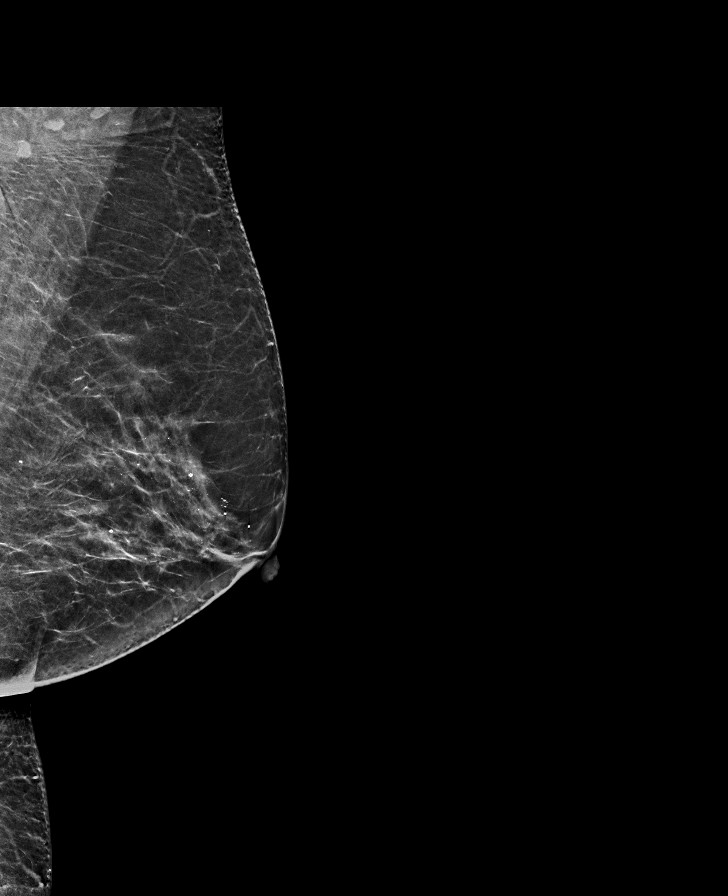

[R CC tomo · tomo slice 31/60.0]
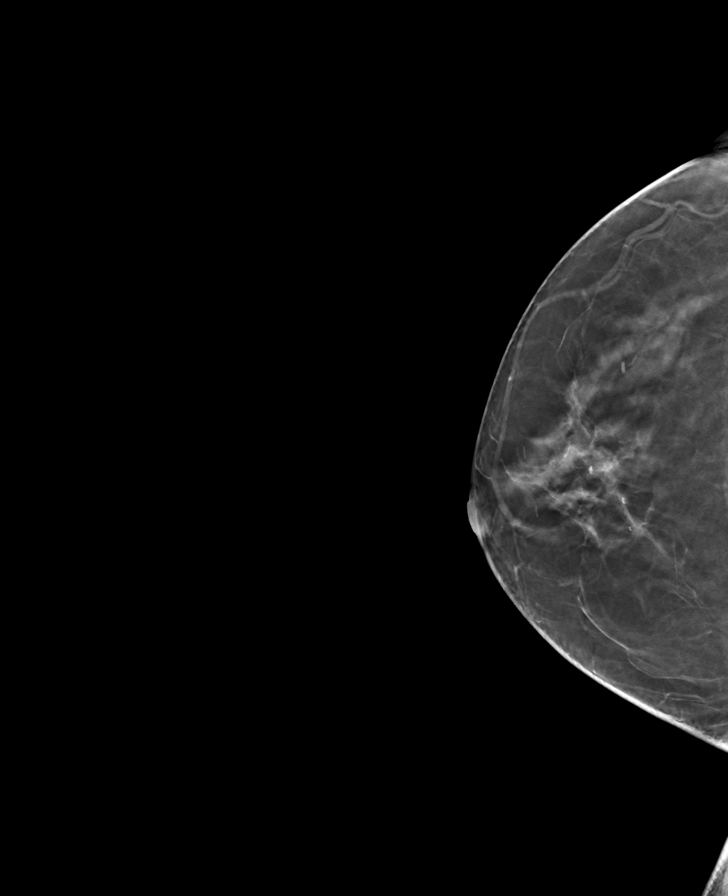

[R MLO tomo · tomo slice 30/59.0]
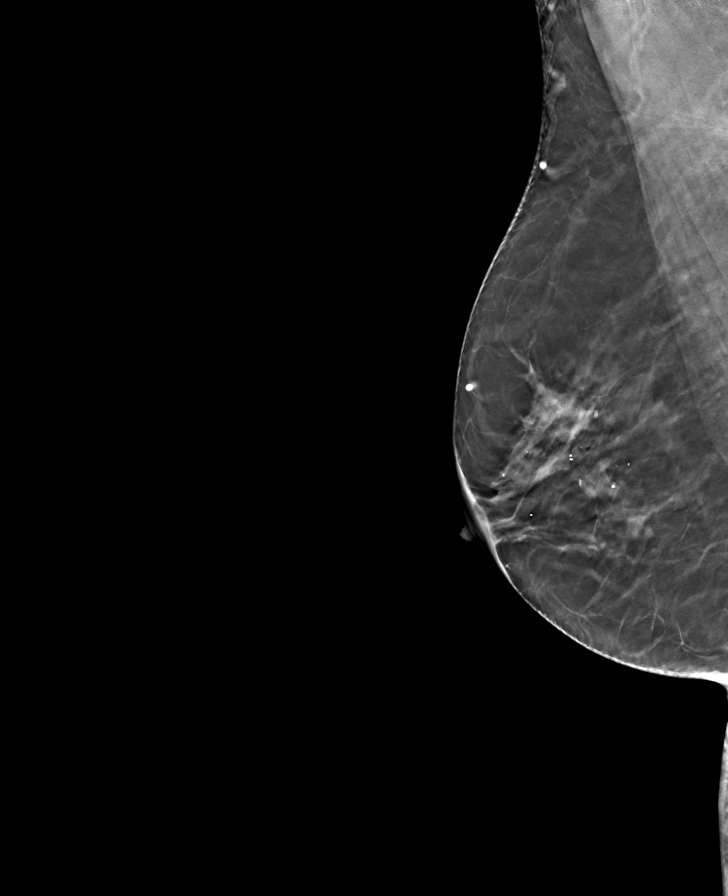

[L MLO tomo · tomo slice 31/62.0]
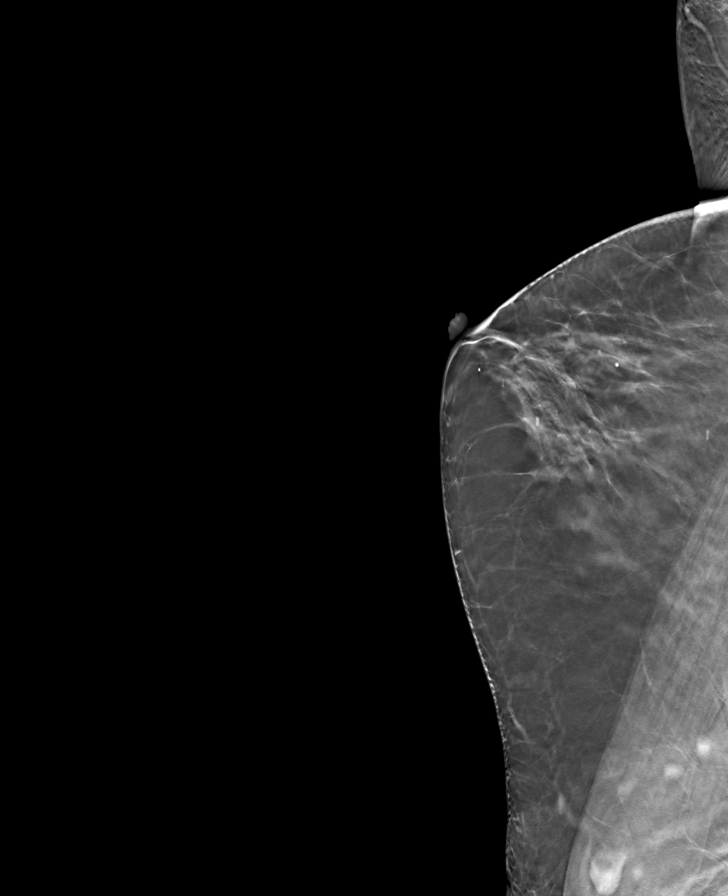

[L CC tomo · tomo slice 31/61.0]
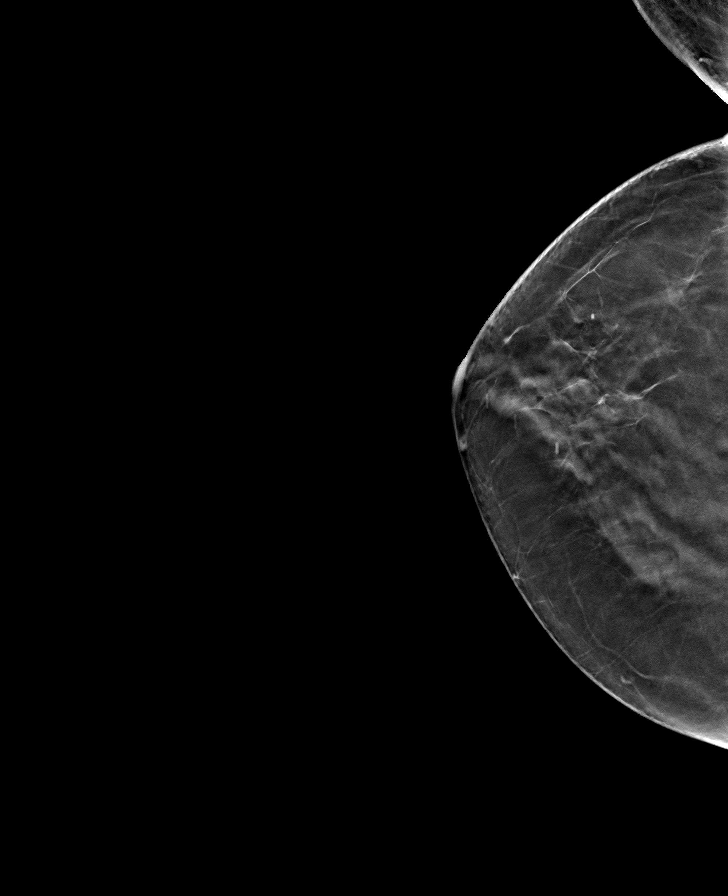

[8 of 24 positions shown; findings below may reference images not displayed]

ACR Breast Density Category c: The breast tissue is heterogeneously
dense, which may obscure small masses.
FINDINGS: There are no findings suspicious for malignancy. Images were
processed with CAD.
IMPRESSION: No mammographic evidence of malignancy. A result letter of this
screening mammogram will be mailed directly to the patient.

RECOMMENDATION:
Screening mammogram in one year. (Code:[id number]-U-LHB)

BI-RADS CATEGORY  1: Negative.

## 2022-10-22 ENCOUNTER — Other Ambulatory Visit: Payer: Self-pay | Admitting: Internal Medicine

## 2022-10-22 DIAGNOSIS — Z1231 Encounter for screening mammogram for malignant neoplasm of breast: Secondary | ICD-10-CM

## 2022-12-21 ENCOUNTER — Ambulatory Visit
Admission: RE | Admit: 2022-12-21 | Discharge: 2022-12-21 | Disposition: A | Discharge status: Home / Self Care | Payer: Medicare Other | Source: Ambulatory Visit | Attending: Internal Medicine | Admitting: Internal Medicine

## 2022-12-21 DIAGNOSIS — Z1231 Encounter for screening mammogram for malignant neoplasm of breast: Secondary | ICD-10-CM | POA: Insufficient documentation

## 2023-02-20 ENCOUNTER — Encounter: Payer: Self-pay | Admitting: Podiatry

## 2023-02-20 ENCOUNTER — Ambulatory Visit (INDEPENDENT_AMBULATORY_CARE_PROVIDER_SITE_OTHER): Payer: Medicare Other | Admitting: Podiatry

## 2023-02-20 DIAGNOSIS — M778 Other enthesopathies, not elsewhere classified: Secondary | ICD-10-CM | POA: Diagnosis not present

## 2023-02-20 NOTE — Progress Notes (Signed)
She presents today chief complaint of and intermittent pain to the dorsal lateral aspect of her left foot.  States that back in October 7 and again on the 22nd she had a sharp pain to the dorsal lateral aspect of the foot overlying the fourth fifth tarsometatarsal joint states that she had a cyst that was present there it was red and inflamed she googled what to do for a ganglion cyst and it suggested green tea bags after being bruised.  So she did this states that the pain went away and has not seen the cyst present since 22 October.  Objective: Vitals are stable oriented x 3 pulses are palpable.  There is no erythema Dem salines drainage or odor to the left foot.  No signs of infection.  No reproducible tenderness other than frontal plane range of motion at the point of maximal tenderness overlying fourth tarsometatarsal joint appears to be a small spur probably on the dorsal medial aspect of the fifth metatarsal left foot at the fourth fifth articulation.  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  Plan: Discussed etiology pathology and surgical therapies at this point she is can follow-up with me on an as-needed basis she will take a picture of this at its worst with her telephone.

## 2023-03-15 ENCOUNTER — Ambulatory Visit: Payer: Medicare Other

## 2023-03-15 DIAGNOSIS — Z1211 Encounter for screening for malignant neoplasm of colon: Secondary | ICD-10-CM | POA: Diagnosis not present

## 2023-03-15 DIAGNOSIS — K640 First degree hemorrhoids: Secondary | ICD-10-CM | POA: Diagnosis not present

## 2023-05-13 ENCOUNTER — Encounter: Payer: Self-pay | Admitting: Podiatry

## 2023-05-13 ENCOUNTER — Ambulatory Visit (INDEPENDENT_AMBULATORY_CARE_PROVIDER_SITE_OTHER): Payer: Medicare Other | Admitting: Podiatry

## 2023-05-13 VITALS — Ht 64.0 in | Wt 160.0 lb

## 2023-05-13 DIAGNOSIS — M67472 Ganglion, left ankle and foot: Secondary | ICD-10-CM | POA: Diagnosis not present

## 2023-05-13 NOTE — Progress Notes (Signed)
  Subjective:  Patient ID: CHEVON MARIER, female    DOB: 08-Nov-1950,  MRN: [id number]  Chief Complaint  Patient presents with   Foot Pain    Pt is here due to ganglion cyst on the side of her left foot, pt states the pain is unbearable and hard for her to walk on that foot, states she needs something to relieve the pain, pain radiates throughout the side of her left foot.    73 y.o. female presents with the above complaint. History confirmed with patient.   Objective:  Physical Exam: warm, good capillary refill, no trophic changes or ulcerative lesions, normal DP and PT pulses, normal sensory exam, and ganglion cyst to the dorsal fourth TMT  Assessment:   1. Ganglion cyst of left foot      Plan:  Patient was evaluated and treated and all questions answered.  Discussed treatment options.  Recommended drainage of the cyst and injection of corticosteroid.  Following consent and prepped with alcohol and Betadine the left foot was anesthetized with a field block with lidocaine and Marcaine.  A #11 blade was used to puncture the cyst and drained its contents which were consistent with a ganglion.  10 mg of Kenalog 2 mg of dexamethasone and 0.5 cc of 0.5% Marcaine plain were injected into the cyst and dorsal fourth TMT.  If it returns quickly would recommend reevaluating and looking at surgical options  Return if symptoms worsen or fail to improve.

## 2023-05-21 DIAGNOSIS — M542 Cervicalgia: Secondary | ICD-10-CM | POA: Insufficient documentation

## 2023-07-24 ENCOUNTER — Encounter: Payer: Self-pay | Admitting: Podiatry

## 2023-07-24 ENCOUNTER — Ambulatory Visit (INDEPENDENT_AMBULATORY_CARE_PROVIDER_SITE_OTHER): Admitting: Podiatry

## 2023-07-24 DIAGNOSIS — Z981 Arthrodesis status: Secondary | ICD-10-CM | POA: Insufficient documentation

## 2023-07-24 DIAGNOSIS — M67472 Ganglion, left ankle and foot: Secondary | ICD-10-CM | POA: Diagnosis not present

## 2023-07-24 MED ORDER — TRIAMCINOLONE ACETONIDE 40 MG/ML IJ SUSP
20.0000 mg | Freq: Once | INTRAMUSCULAR | Status: AC
Start: 2023-07-24 — End: 2023-07-24
  Administered 2023-07-24: 20 mg

## 2023-07-24 NOTE — Progress Notes (Signed)
 She presents today concerned about a ganglion cyst to the dorsal aspect of her left foot.  She states that it flares up and she is going on a trip soon and she just is worried that it may flareup on the trip and is wondering if there is anything she should do.  Objective: Vital signs are stable alert oriented x 3 she has a ganglion cyst overlying the distal aspect of the fourth fifth tarsometatarsal joints of the left foot.  It is multilobulated nonpulsatile and fluctuant.  Assessment: Ganglion cyst left foot.  Plan: Aspiration of the ganglion cyst today after local anesthetic and Betadine were applied she was also injected into the ganglion with a small amount of Kenalog.  I placed a compression wrap over the lesion and will follow-up with her on an as-needed basis.

## 2023-10-04 ENCOUNTER — Other Ambulatory Visit: Payer: Self-pay | Admitting: Neurosurgery

## 2023-10-04 DIAGNOSIS — M48062 Spinal stenosis, lumbar region with neurogenic claudication: Secondary | ICD-10-CM

## 2023-10-04 DIAGNOSIS — M4722 Other spondylosis with radiculopathy, cervical region: Secondary | ICD-10-CM

## 2023-10-10 NOTE — Discharge Instructions (Signed)

## 2023-10-11 ENCOUNTER — Ambulatory Visit
Admission: RE | Admit: 2023-10-11 | Discharge: 2023-10-11 | Disposition: A | Discharge status: Home / Self Care | Source: Ambulatory Visit | Attending: Neurosurgery | Admitting: Neurosurgery

## 2023-10-11 DIAGNOSIS — M4722 Other spondylosis with radiculopathy, cervical region: Secondary | ICD-10-CM

## 2023-10-11 DIAGNOSIS — M48062 Spinal stenosis, lumbar region with neurogenic claudication: Secondary | ICD-10-CM

## 2023-10-11 MED ORDER — DIAZEPAM 5 MG PO TABS
5.0000 mg | ORAL_TABLET | Freq: Once | ORAL | Status: DC
Start: 2023-10-11 — End: 2023-10-12

## 2023-10-11 MED ORDER — IOPAMIDOL (ISOVUE-M 300) INJECTION 61%
10.0000 mL | Freq: Once | INTRAMUSCULAR | Status: AC
  Administered 2023-10-11: 10 mL via INTRATHECAL

## 2023-10-11 MED ORDER — MEPERIDINE HCL 50 MG/ML IJ SOLN: 50.0000 mg | Freq: Once | INTRAMUSCULAR | Status: DC | PRN

## 2023-10-11 MED ORDER — ONDANSETRON HCL 4 MG/2ML IJ SOLN: 4.0000 mg | Freq: Once | INTRAMUSCULAR | Status: DC | PRN

## 2023-11-08 ENCOUNTER — Other Ambulatory Visit: Payer: Self-pay | Admitting: Neurosurgery

## 2023-11-19 NOTE — Pre-Procedure Instructions (Signed)
 Surgical Instructions   Your procedure is scheduled on December 04, 2023. Report to [facility] Main Entrance A at 8:30 A.M., then check in with the Admitting office. Any questions or running late day of surgery: call [phone]  Questions prior to your surgery date: call [phone], Monday-Friday, 8am-4pm. If you experience any cold or flu symptoms such as cough, fever, chills, shortness of breath, etc. between now and your scheduled surgery, please notify us  at the above number.     Remember:  Do not eat or drink after midnight the night before your surgery    Take these medicines the morning of surgery with A SIP OF WATER : NONE   May take these medicines IF NEEDED: carboxymethylcellulose (REFRESH PLUS) eye drops   One week prior to surgery, STOP taking any Aspirin  (unless otherwise instructed by your surgeon) Aleve, Naproxen, Ibuprofen, Motrin, Advil, Goody's, BC's, all herbal medications, fish oil, and non-prescription vitamins.                     Do NOT Smoke (Tobacco/Vaping) for 24 hours prior to your procedure.  If you use a CPAP at night, you may bring your mask/headgear for your overnight stay.   You will be asked to remove any contacts, glasses, piercing's, hearing aid's, dentures/partials prior to surgery. Please bring cases for these items if needed.    Patients discharged the day of surgery will not be allowed to drive home, and someone needs to stay with them for 24 hours.  SURGICAL WAITING ROOM VISITATION Patients may have no more than 2 support people in the waiting area - these visitors may rotate.   Pre-op nurse will coordinate an appropriate time for 1 ADULT support person, who may not rotate, to accompany patient in pre-op.  Children under the age of 69 must have an adult with them who is not the patient and must remain in the main waiting area with an adult.  If the patient needs to stay at the hospital during part of their recovery, the visitor  guidelines for inpatient rooms apply.  Please refer to the [facility] website for the visitor guidelines for any additional information.   If you received a COVID test during your pre-op visit  it is requested that you wear a mask when out in public, stay away from anyone that may not be feeling well and notify your surgeon if you develop symptoms. If you have been in contact with anyone that has tested positive in the last 10 days please notify you surgeon.      Pre-operative 5 CHG Bathing Instructions   You can play a key role in reducing the risk of infection after surgery. Your skin needs to be as free of germs as possible. You can reduce the number of germs on your skin by washing with CHG (chlorhexidine  gluconate) soap before surgery. CHG is an antiseptic soap that kills germs and continues to kill germs even after washing.   DO NOT use if you have an allergy to chlorhexidine /CHG or antibacterial soaps. If your skin becomes reddened or irritated, stop using the CHG and notify one of our RNs at [phone].   Please shower with the CHG soap starting 4 days before surgery using the following schedule:     Please keep in mind the following:  DO NOT shave, including legs and underarms, starting the day of your first shower.   You may shave your face at any point before/day of surgery.  Place  clean sheets on your bed the day you start using CHG soap. Use a clean washcloth (not used since being washed) for each shower. DO NOT sleep with pets once you start using the CHG.   CHG Shower Instructions:  Wash your face and private area with normal soap. If you choose to wash your hair, wash first with your normal shampoo.  After you use shampoo/soap, rinse your hair and body thoroughly to remove shampoo/soap residue.  Turn the water  OFF and apply about 3 tablespoons (45 ml) of CHG soap to a CLEAN washcloth.  Apply CHG soap ONLY FROM YOUR NECK DOWN TO YOUR TOES (washing for 3-5 minutes)   DO NOT use CHG soap on face, private areas, open wounds, or sores.  Pay special attention to the area where your surgery is being performed.  If you are having back surgery, having someone wash your back for you may be helpful. Wait 2 minutes after CHG soap is applied, then you may rinse off the CHG soap.  Pat dry with a clean towel  Put on clean clothes/pajamas   If you choose to wear lotion, please use ONLY the CHG-compatible lotions that are listed below.  Additional instructions for the day of surgery: DO NOT APPLY any lotions, deodorants, cologne, or perfumes.   Do not bring valuables to the hospital. [facility] is not responsible for any belongings/valuables. Do not wear nail polish, gel polish, artificial nails, or any other type of covering on natural nails (fingers and toes) Do not wear jewelry or makeup Put on clean/comfortable clothes.  Please brush your teeth.  Ask your nurse before applying any prescription medications to the skin.     CHG Compatible Lotions   Aveeno Moisturizing lotion  Cetaphil Moisturizing Cream  Cetaphil Moisturizing Lotion  Clairol Herbal Essence Moisturizing Lotion, Dry Skin  Clairol Herbal Essence Moisturizing Lotion, Extra Dry Skin  Clairol Herbal Essence Moisturizing Lotion, Normal Skin  Curel Age Defying Therapeutic Moisturizing Lotion with Alpha Hydroxy  Curel Extreme Care Body Lotion  Curel Soothing Hands Moisturizing Hand Lotion  Curel Therapeutic Moisturizing Cream, Fragrance-Free  Curel Therapeutic Moisturizing Lotion, Fragrance-Free  Curel Therapeutic Moisturizing Lotion, Original Formula  Eucerin Daily Replenishing Lotion  Eucerin Dry Skin Therapy Plus Alpha Hydroxy Crme  Eucerin Dry Skin Therapy Plus Alpha Hydroxy Lotion  Eucerin Original Crme  Eucerin Original Lotion  Eucerin Plus Crme Eucerin Plus Lotion  Eucerin TriLipid Replenishing Lotion  Keri Anti-Bacterial Hand Lotion  Keri Deep Conditioning Original Lotion Dry  Skin Formula Softly Scented  Keri Deep Conditioning Original Lotion, Fragrance Free Sensitive Skin Formula  Keri Lotion Fast Absorbing Fragrance Free Sensitive Skin Formula  Keri Lotion Fast Absorbing Softly Scented Dry Skin Formula  Keri Original Lotion  Keri Skin Renewal Lotion Keri Silky Smooth Lotion  Keri Silky Smooth Sensitive Skin Lotion  Nivea Body Creamy Conditioning Oil  Nivea Body Extra Enriched Lotion  Nivea Body Original Lotion  Nivea Body Sheer Moisturizing Lotion Nivea Crme  Nivea Skin Firming Lotion  NutraDerm 30 Skin Lotion  NutraDerm Skin Lotion  NutraDerm Therapeutic Skin Cream  NutraDerm Therapeutic Skin Lotion  ProShield Protective Hand Cream  Provon moisturizing lotion  Please read over the following fact sheets that you were given.

## 2023-11-20 ENCOUNTER — Encounter (HOSPITAL_COMMUNITY): Payer: Self-pay

## 2023-11-20 ENCOUNTER — Other Ambulatory Visit: Payer: Self-pay

## 2023-11-20 ENCOUNTER — Encounter (HOSPITAL_COMMUNITY)
Admission: RE | Admit: 2023-11-20 | Discharge: 2023-11-20 | Disposition: A | Discharge status: Home / Self Care | Source: Ambulatory Visit | Attending: Neurosurgery | Admitting: Neurosurgery

## 2023-11-20 VITALS — BP 138/72 | HR 86 | Temp 97.8°F | Resp 17 | Ht 64.0 in | Wt 146.9 lb

## 2023-11-20 DIAGNOSIS — Z01812 Encounter for preprocedural laboratory examination: Secondary | ICD-10-CM | POA: Diagnosis present

## 2023-11-20 DIAGNOSIS — I2510 Atherosclerotic heart disease of native coronary artery without angina pectoris: Secondary | ICD-10-CM | POA: Insufficient documentation

## 2023-11-20 DIAGNOSIS — Z01818 Encounter for other preprocedural examination: Secondary | ICD-10-CM

## 2023-11-20 LAB — CBC
HCT: 45.7 % (ref 36.0–46.0)
Hemoglobin: 14.9 g/dL (ref 12.0–15.0)
MCH: 31.8 pg (ref 26.0–34.0)
MCHC: 32.6 g/dL (ref 30.0–36.0)
MCV: 97.4 fL (ref 80.0–100.0)
Platelets: 194 K/uL (ref 150–400)
RBC: 4.69 MIL/uL (ref 3.87–5.11)
RDW: 12.9 % (ref 11.5–15.5)
WBC: 7.1 K/uL (ref 4.0–10.5)
nRBC: 0 % (ref 0.0–0.2)

## 2023-11-20 LAB — SURGICAL PCR SCREEN
MRSA, PCR: NEGATIVE
Staphylococcus aureus: NEGATIVE

## 2023-11-20 LAB — TYPE AND SCREEN
ABO/RH(D): O POS
Antibody Screen: NEGATIVE

## 2023-11-20 LAB — BASIC METABOLIC PANEL WITH GFR
Anion gap: 7 (ref 5–15)
BUN: 22 mg/dL (ref 8–23)
CO2: 26 mmol/L (ref 22–32)
Calcium: 9.2 mg/dL (ref 8.9–10.3)
Chloride: 106 mmol/L (ref 98–111)
Creatinine, Ser: 0.67 mg/dL (ref 0.44–1.00)
GFR, Estimated: >60 mL/min (ref >60)
Glucose, Bld: 108 mg/dL — ABNORMAL HIGH (ref 70–99)
Potassium: 4.2 mmol/L (ref 3.5–5.1)
Sodium: 139 mmol/L (ref 135–145)

## 2023-11-20 NOTE — Progress Notes (Signed)
 PCP - Dr. Tamra Leventhal Cardiologist - Denies  PPM/ICD - Denies Device Orders - n/a Rep Notified - n/a  Chest x-ray - n/a EKG - Denies Stress Test - Denies - possibly completed many years ago for CP. Result normal ECHO - Denies - possibly completed many years ago for CP. Result normal Cardiac Cath - Denies  Sleep Study - Denies CPAP - n/a  No DM  Last dose of GLP1 agonist- n/a GLP1 instructions: n/a  Blood Thinner Instructions: n/a Aspirin  Instructions: n/a  NPO after midnight  COVID TEST- n/a   Anesthesia review: No. Pt endorses taking OTC medicine for sinus headache this morning (7/30) but denies any additional symptoms. Pt instructed to call if she were to develop any additional symptoms (I.e. fever, productive cough, sore throat, runny nose etc). Pt understood instructions. She denies any respiratory illness/infection in the last two months.    Patient denies shortness of breath, fever, cough and chest pain at PAT appointment   All instructions explained to the patient, with a verbal understanding of the material. Patient agrees to go over the instructions while at home for a better understanding. Patient also instructed to self quarantine after being tested for COVID-19. The opportunity to ask questions was provided.

## 2023-12-04 ENCOUNTER — Ambulatory Visit (HOSPITAL_COMMUNITY)
Admission: RE | Admit: 2023-12-04 | Discharge: 2023-12-06 | Disposition: A | Discharge status: Home / Self Care | Attending: Neurosurgery | Admitting: Neurosurgery

## 2023-12-04 ENCOUNTER — Ambulatory Visit (HOSPITAL_COMMUNITY)

## 2023-12-04 ENCOUNTER — Other Ambulatory Visit: Payer: Self-pay

## 2023-12-04 ENCOUNTER — Encounter (HOSPITAL_COMMUNITY): Payer: Self-pay | Admitting: Neurosurgery

## 2023-12-04 ENCOUNTER — Ambulatory Visit (HOSPITAL_BASED_OUTPATIENT_CLINIC_OR_DEPARTMENT_OTHER): Payer: Self-pay | Admitting: Anesthesiology

## 2023-12-04 ENCOUNTER — Ambulatory Visit (HOSPITAL_COMMUNITY): Payer: Self-pay | Admitting: Anesthesiology

## 2023-12-04 ENCOUNTER — Encounter (HOSPITAL_COMMUNITY)
Admission: RE | Disposition: A | Discharge status: Home / Self Care | Payer: Self-pay | Source: Home / Self Care | Attending: Neurosurgery

## 2023-12-04 DIAGNOSIS — M4316 Spondylolisthesis, lumbar region: Secondary | ICD-10-CM | POA: Diagnosis not present

## 2023-12-04 DIAGNOSIS — Z981 Arthrodesis status: Secondary | ICD-10-CM | POA: Insufficient documentation

## 2023-12-04 DIAGNOSIS — M5116 Intervertebral disc disorders with radiculopathy, lumbar region: Secondary | ICD-10-CM

## 2023-12-04 DIAGNOSIS — M48062 Spinal stenosis, lumbar region with neurogenic claudication: Secondary | ICD-10-CM

## 2023-12-04 DIAGNOSIS — M5136 Other intervertebral disc degeneration, lumbar region with discogenic back pain only: Secondary | ICD-10-CM | POA: Insufficient documentation

## 2023-12-04 DIAGNOSIS — E785 Hyperlipidemia, unspecified: Secondary | ICD-10-CM

## 2023-12-04 DIAGNOSIS — M51369 Other intervertebral disc degeneration, lumbar region without mention of lumbar back pain or lower extremity pain: Secondary | ICD-10-CM

## 2023-12-04 DIAGNOSIS — Z87891 Personal history of nicotine dependence: Secondary | ICD-10-CM | POA: Insufficient documentation

## 2023-12-04 DIAGNOSIS — M5416 Radiculopathy, lumbar region: Secondary | ICD-10-CM | POA: Insufficient documentation

## 2023-12-04 SURGERY — POSTERIOR LUMBAR FUSION 1 LEVEL
Anesthesia: General

## 2023-12-04 MED ORDER — ACETAMINOPHEN 650 MG RE SUPP: 650.0000 mg | RECTAL | Status: DC | PRN

## 2023-12-04 MED ORDER — ROCURONIUM BROMIDE 10 MG/ML (PF) SYRINGE
PREFILLED_SYRINGE | INTRAVENOUS | Status: AC
  Filled 2023-12-04: qty 20

## 2023-12-04 MED ORDER — EPHEDRINE SULFATE-NACL 50-0.9 MG/10ML-% IV SOSY
PREFILLED_SYRINGE | INTRAVENOUS | Status: DC | PRN
  Administered 2023-12-04 (×8): 5 mg via INTRAVENOUS

## 2023-12-04 MED ORDER — SODIUM CHLORIDE 0.9% FLUSH
3.0000 mL | Freq: Two times a day (BID) | INTRAVENOUS | Status: DC
  Administered 2023-12-04 – 2023-12-06 (×5): 3 mL via INTRAVENOUS

## 2023-12-04 MED ORDER — BISACODYL 10 MG RE SUPP: 10.0000 mg | Freq: Every day | RECTAL | Status: DC | PRN

## 2023-12-04 MED ORDER — OXYCODONE HCL 5 MG PO TABS
5.0000 mg | ORAL_TABLET | Freq: Once | ORAL | Status: AC | PRN
  Administered 2023-12-04 (×2): 5 mg via ORAL

## 2023-12-04 MED ORDER — PROPOFOL 10 MG/ML IV BOLUS
INTRAVENOUS | Status: AC
  Filled 2023-12-04: qty 20

## 2023-12-04 MED ORDER — FENTANYL CITRATE (PF) 250 MCG/5ML IJ SOLN
INTRAMUSCULAR | Status: DC | PRN
  Administered 2023-12-04: 25 ug via INTRAVENOUS
  Administered 2023-12-04 (×2): 50 ug via INTRAVENOUS
  Administered 2023-12-04 (×2): 25 ug via INTRAVENOUS
  Administered 2023-12-04: 50 ug via INTRAVENOUS
  Administered 2023-12-04: 100 ug via INTRAVENOUS
  Administered 2023-12-04: 25 ug via INTRAVENOUS
  Administered 2023-12-04: 100 ug via INTRAVENOUS
  Administered 2023-12-04: 50 ug via INTRAVENOUS

## 2023-12-04 MED ORDER — BUPIVACAINE LIPOSOME 1.3 % IJ SUSP
INTRAMUSCULAR | Status: AC
  Filled 2023-12-04: qty 20

## 2023-12-04 MED ORDER — CHLORHEXIDINE GLUCONATE CLOTH 2 % EX PADS: 6.0000 | MEDICATED_PAD | Freq: Once | CUTANEOUS | Status: DC

## 2023-12-04 MED ORDER — LIDOCAINE 2% (20 MG/ML) 5 ML SYRINGE
INTRAMUSCULAR | Status: AC
Start: 2023-12-04 — End: 2023-12-04
  Filled 2023-12-04: qty 5

## 2023-12-04 MED ORDER — OXYCODONE HCL 5 MG PO TABS
10.0000 mg | ORAL_TABLET | ORAL | Status: DC | PRN
  Administered 2023-12-05 (×2): 10 mg via ORAL
  Filled 2023-12-04 (×2): qty 2

## 2023-12-04 MED ORDER — CYCLOBENZAPRINE HCL 10 MG PO TABS
ORAL_TABLET | ORAL | Status: AC
  Filled 2023-12-04: qty 1

## 2023-12-04 MED ORDER — OXYCODONE HCL 5 MG PO TABS
5.0000 mg | ORAL_TABLET | ORAL | Status: DC | PRN
  Administered 2023-12-04 – 2023-12-06 (×7): 5 mg via ORAL
  Filled 2023-12-04 (×7): qty 1

## 2023-12-04 MED ORDER — CEFAZOLIN SODIUM-DEXTROSE 2-3 GM-%(50ML) IV SOLR
INTRAVENOUS | Status: DC | PRN
  Administered 2023-12-04 (×2): 2 g via INTRAVENOUS

## 2023-12-04 MED ORDER — ROCURONIUM BROMIDE 10 MG/ML (PF) SYRINGE
PREFILLED_SYRINGE | INTRAVENOUS | Status: DC | PRN
  Administered 2023-12-04 (×6): 10 mg via INTRAVENOUS
  Administered 2023-12-04: 20 mg via INTRAVENOUS
  Administered 2023-12-04 (×8): 10 mg via INTRAVENOUS
  Administered 2023-12-04: 50 mg via INTRAVENOUS
  Administered 2023-12-04: 20 mg via INTRAVENOUS
  Administered 2023-12-04: 50 mg via INTRAVENOUS

## 2023-12-04 MED ORDER — ACETAMINOPHEN 10 MG/ML IV SOLN
INTRAVENOUS | Status: DC | PRN
Start: 2023-12-04 — End: 2023-12-04
  Administered 2023-12-04 (×2): 1000 mg via INTRAVENOUS

## 2023-12-04 MED ORDER — HYDROMORPHONE HCL 1 MG/ML IJ SOLN
0.2500 mg | INTRAMUSCULAR | Status: DC | PRN
  Administered 2023-12-04 (×8): 0.5 mg via INTRAVENOUS

## 2023-12-04 MED ORDER — HYDROMORPHONE HCL 1 MG/ML IJ SOLN
INTRAMUSCULAR | Status: AC
  Filled 2023-12-04: qty 1

## 2023-12-04 MED ORDER — MENTHOL 3 MG MT LOZG: 1.0000 | LOZENGE | OROMUCOSAL | Status: DC | PRN

## 2023-12-04 MED ORDER — OXYCODONE HCL 5 MG/5ML PO SOLN: 5.0000 mg | Freq: Once | ORAL | Status: AC | PRN

## 2023-12-04 MED ORDER — DOCUSATE SODIUM 100 MG PO CAPS
100.0000 mg | ORAL_CAPSULE | Freq: Two times a day (BID) | ORAL | Status: DC
  Administered 2023-12-04 – 2023-12-06 (×5): 100 mg via ORAL
  Filled 2023-12-04 (×4): qty 1

## 2023-12-04 MED ORDER — ONDANSETRON HCL 4 MG PO TABS: 4.0000 mg | ORAL_TABLET | Freq: Four times a day (QID) | ORAL | Status: DC | PRN

## 2023-12-04 MED ORDER — CEFAZOLIN SODIUM-DEXTROSE 2-4 GM/100ML-% IV SOLN
2.0000 g | Freq: Three times a day (TID) | INTRAVENOUS | Status: AC
  Administered 2023-12-04 – 2023-12-05 (×3): 2 g via INTRAVENOUS
  Filled 2023-12-04 (×2): qty 100

## 2023-12-04 MED ORDER — PHENYLEPHRINE HCL-NACL 20-0.9 MG/250ML-% IV SOLN
INTRAVENOUS | Status: DC | PRN
  Administered 2023-12-04 (×2): 20 ug/min via INTRAVENOUS

## 2023-12-04 MED ORDER — LACTATED RINGERS IV SOLN: INTRAVENOUS | Status: DC | PRN

## 2023-12-04 MED ORDER — CHLORHEXIDINE GLUCONATE 0.12 % MT SOLN
15.0000 mL | Freq: Once | OROMUCOSAL | Status: AC
  Administered 2023-12-04 (×2): 15 mL via OROMUCOSAL
  Filled 2023-12-04: qty 15

## 2023-12-04 MED ORDER — PHENYLEPHRINE 80 MCG/ML (10ML) SYRINGE FOR IV PUSH (FOR BLOOD PRESSURE SUPPORT)
PREFILLED_SYRINGE | INTRAVENOUS | Status: AC
  Filled 2023-12-04: qty 20

## 2023-12-04 MED ORDER — ACETAMINOPHEN 10 MG/ML IV SOLN
INTRAVENOUS | Status: AC
  Filled 2023-12-04: qty 100

## 2023-12-04 MED ORDER — DEXAMETHASONE SODIUM PHOSPHATE 10 MG/ML IJ SOLN
INTRAMUSCULAR | Status: AC
Start: 2023-12-04 — End: 2023-12-04
  Filled 2023-12-04: qty 1

## 2023-12-04 MED ORDER — LACTATED RINGERS IV SOLN: INTRAVENOUS | Status: DC

## 2023-12-04 MED ORDER — DEXAMETHASONE SODIUM PHOSPHATE 10 MG/ML IJ SOLN
INTRAMUSCULAR | Status: AC
  Filled 2023-12-04: qty 1

## 2023-12-04 MED ORDER — ORAL CARE MOUTH RINSE: 15.0000 mL | Freq: Once | OROMUCOSAL | Status: AC

## 2023-12-04 MED ORDER — BACITRACIN ZINC 500 UNIT/GM EX OINT
TOPICAL_OINTMENT | CUTANEOUS | Status: AC
Start: 2023-12-04 — End: 2023-12-04
  Filled 2023-12-04: qty 28.35

## 2023-12-04 MED ORDER — ACETAMINOPHEN 325 MG PO TABS
650.0000 mg | ORAL_TABLET | ORAL | Status: DC | PRN
  Filled 2023-12-04 (×3): qty 2

## 2023-12-04 MED ORDER — PHENOL 1.4 % MT LIQD
1.0000 | OROMUCOSAL | Status: DC | PRN
Start: 2023-12-04 — End: 2023-12-06

## 2023-12-04 MED ORDER — SODIUM CHLORIDE 0.9 % IV SOLN
250.0000 mL | INTRAVENOUS | Status: AC
  Administered 2023-12-04 (×2): 250 mL via INTRAVENOUS

## 2023-12-04 MED ORDER — CEFAZOLIN SODIUM-DEXTROSE 2-4 GM/100ML-% IV SOLN
2.0000 g | INTRAVENOUS | Status: DC
  Filled 2023-12-04: qty 100

## 2023-12-04 MED ORDER — MIDAZOLAM HCL 2 MG/2ML IJ SOLN
INTRAMUSCULAR | Status: DC | PRN
  Administered 2023-12-04 (×2): 1 mg via INTRAVENOUS

## 2023-12-04 MED ORDER — BUPIVACAINE-EPINEPHRINE (PF) 0.5% -1:200000 IJ SOLN
INTRAMUSCULAR | Status: DC | PRN
  Administered 2023-12-04 (×2): 10 mL via PERINEURAL

## 2023-12-04 MED ORDER — ONDANSETRON HCL 4 MG/2ML IJ SOLN
INTRAMUSCULAR | Status: AC
  Filled 2023-12-04: qty 2

## 2023-12-04 MED ORDER — 0.9 % SODIUM CHLORIDE (POUR BTL) OPTIME
TOPICAL | Status: DC | PRN
  Administered 2023-12-04 (×2): 1000 mL

## 2023-12-04 MED ORDER — MORPHINE SULFATE (PF) 2 MG/ML IV SOLN: 2.0000 mg | INTRAVENOUS | Status: DC | PRN

## 2023-12-04 MED ORDER — DROPERIDOL 2.5 MG/ML IJ SOLN: 0.6250 mg | Freq: Once | INTRAMUSCULAR | Status: DC | PRN

## 2023-12-04 MED ORDER — LIDOCAINE 2% (20 MG/ML) 5 ML SYRINGE
INTRAMUSCULAR | Status: DC | PRN
  Administered 2023-12-04 (×2): 60 mg via INTRAVENOUS

## 2023-12-04 MED ORDER — CYCLOBENZAPRINE HCL 10 MG PO TABS
10.0000 mg | ORAL_TABLET | Freq: Three times a day (TID) | ORAL | Status: DC | PRN
  Administered 2023-12-04 – 2023-12-06 (×5): 10 mg via ORAL
  Filled 2023-12-04 (×3): qty 1

## 2023-12-04 MED ORDER — SUGAMMADEX SODIUM 200 MG/2ML IV SOLN
INTRAVENOUS | Status: DC | PRN
  Administered 2023-12-04: 50 mg via INTRAVENOUS
  Administered 2023-12-04: 100 mg via INTRAVENOUS
  Administered 2023-12-04 (×2): 50 mg via INTRAVENOUS
  Administered 2023-12-04: 100 mg via INTRAVENOUS
  Administered 2023-12-04: 50 mg via INTRAVENOUS

## 2023-12-04 MED ORDER — DEXAMETHASONE SODIUM PHOSPHATE 10 MG/ML IJ SOLN
INTRAMUSCULAR | Status: DC | PRN
  Administered 2023-12-04 (×2): 10 mg via INTRAVENOUS

## 2023-12-04 MED ORDER — BUPIVACAINE LIPOSOME 1.3 % IJ SUSP
INTRAMUSCULAR | Status: DC | PRN
  Administered 2023-12-04 (×2): 20 mL

## 2023-12-04 MED ORDER — PROPOFOL 10 MG/ML IV BOLUS
INTRAVENOUS | Status: DC | PRN
  Administered 2023-12-04 (×2): 140 mg via INTRAVENOUS

## 2023-12-04 MED ORDER — MIDAZOLAM HCL 2 MG/2ML IJ SOLN
INTRAMUSCULAR | Status: AC
  Filled 2023-12-04: qty 2

## 2023-12-04 MED ORDER — ONDANSETRON HCL 4 MG/2ML IJ SOLN
INTRAMUSCULAR | Status: DC | PRN
  Administered 2023-12-04 (×2): 4 mg via INTRAVENOUS

## 2023-12-04 MED ORDER — VASHE WOUND IRRIGATION OPTIME
TOPICAL | Status: DC | PRN
  Administered 2023-12-04 (×2): 34 [oz_av] via TOPICAL

## 2023-12-04 MED ORDER — ALBUMIN HUMAN 5 % IV SOLN
INTRAVENOUS | Status: DC | PRN
Start: 2023-12-04 — End: 2023-12-04

## 2023-12-04 MED ORDER — OXYCODONE HCL 5 MG PO TABS
ORAL_TABLET | ORAL | Status: AC
  Filled 2023-12-04: qty 1

## 2023-12-04 MED ORDER — THROMBIN 5000 UNITS EX KIT
PACK | CUTANEOUS | Status: AC
Start: 2023-12-04 — End: 2023-12-04
  Filled 2023-12-04: qty 1

## 2023-12-04 MED ORDER — ONDANSETRON HCL 4 MG/2ML IJ SOLN
INTRAMUSCULAR | Status: AC
Start: 2023-12-04 — End: 2023-12-04
  Filled 2023-12-04: qty 2

## 2023-12-04 MED ORDER — ACETAMINOPHEN 500 MG PO TABS
1000.0000 mg | ORAL_TABLET | Freq: Four times a day (QID) | ORAL | Status: AC
  Administered 2023-12-04 – 2023-12-05 (×5): 1000 mg via ORAL
  Filled 2023-12-04 (×4): qty 2

## 2023-12-04 MED ORDER — SODIUM CHLORIDE 0.9% FLUSH: 3.0000 mL | INTRAVENOUS | Status: DC | PRN

## 2023-12-04 MED ORDER — ZOLPIDEM TARTRATE 5 MG PO TABS: 5.0000 mg | ORAL_TABLET | Freq: Every evening | ORAL | Status: DC | PRN

## 2023-12-04 MED ORDER — ONDANSETRON HCL 4 MG/2ML IJ SOLN: 4.0000 mg | Freq: Four times a day (QID) | INTRAMUSCULAR | Status: DC | PRN

## 2023-12-04 MED ORDER — FENTANYL CITRATE (PF) 250 MCG/5ML IJ SOLN
INTRAMUSCULAR | Status: AC
  Filled 2023-12-04: qty 5

## 2023-12-04 MED ORDER — THROMBIN 5000 UNITS EX SOLR
OROMUCOSAL | Status: DC | PRN
  Administered 2023-12-04 (×2): 5 mL via TOPICAL

## 2023-12-04 MED ORDER — EPHEDRINE 5 MG/ML INJ
INTRAVENOUS | Status: AC
  Filled 2023-12-04: qty 5

## 2023-12-04 MED ORDER — BUPIVACAINE-EPINEPHRINE (PF) 0.5% -1:200000 IJ SOLN
INTRAMUSCULAR | Status: AC
  Filled 2023-12-04: qty 30

## 2023-12-04 MED ORDER — BACITRACIN ZINC 500 UNIT/GM EX OINT
TOPICAL_OINTMENT | CUTANEOUS | Status: DC | PRN
  Administered 2023-12-04 (×2): 1 via TOPICAL

## 2023-12-04 SURGICAL SUPPLY — 61 items
BAG COUNTER SPONGE SURGICOUNT (BAG) ×1 IMPLANT
BASKET BONE COLLECTION (BASKET) ×1 IMPLANT
BENZOIN TINCTURE PRP APPL 2/3 (GAUZE/BANDAGES/DRESSINGS) ×1 IMPLANT
BLADE CLIPPER SURG (BLADE) IMPLANT
BUR MATCHSTICK NEURO 3.0 LAGG (BURR) ×1 IMPLANT
BUR PRECISION FLUTE 6.0 (BURR) ×1 IMPLANT
CAGE ALTERA 10X31X9-13 15D (Cage) IMPLANT
CANISTER SUCTION 3000ML PPV (SUCTIONS) ×1 IMPLANT
CAP REVERE LOCKING (Cap) IMPLANT
CATH FOLEY 2WAY SLVR 5CC 12FR (CATHETERS) IMPLANT
CLEANSER WND VASHE INSTL 34OZ (WOUND CARE) ×1 IMPLANT
CNTNR URN SCR LID CUP LEK RST (MISCELLANEOUS) ×1 IMPLANT
COVER BACK TABLE 60X90IN (DRAPES) ×1 IMPLANT
DRAPE C-ARM 42X72 X-RAY (DRAPES) ×2 IMPLANT
DRAPE HALF SHEET 40X57 (DRAPES) ×1 IMPLANT
DRAPE LAPAROTOMY 100X72X124 (DRAPES) ×1 IMPLANT
DRAPE SURG 17X23 STRL (DRAPES) ×1 IMPLANT
DRSG OPSITE POSTOP 4X6 (GAUZE/BANDAGES/DRESSINGS) ×1 IMPLANT
DRSG OPSITE POSTOP 4X8 (GAUZE/BANDAGES/DRESSINGS) IMPLANT
DRSG TEGADERM 2-3/8X2-3/4 SM (GAUZE/BANDAGES/DRESSINGS) IMPLANT
ELECTRODE BLDE 4.0 EZ CLN MEGD (MISCELLANEOUS) ×1 IMPLANT
ELECTRODE REM PT RTRN 9FT ADLT (ELECTROSURGICAL) ×1 IMPLANT
EVACUATOR 1/8 PVC DRAIN (DRAIN) ×1 IMPLANT
GAUZE 4X4 16PLY ~~LOC~~+RFID DBL (SPONGE) ×1 IMPLANT
GLOVE BIO SURGEON STRL SZ 6 (GLOVE) ×1 IMPLANT
GLOVE BIO SURGEON STRL SZ8 (GLOVE) ×2 IMPLANT
GLOVE BIO SURGEON STRL SZ8.5 (GLOVE) ×2 IMPLANT
GLOVE BIOGEL PI IND STRL 6.5 (GLOVE) ×1 IMPLANT
GLOVE EXAM NITRILE XL STR (GLOVE) IMPLANT
GOWN STRL REUS W/ TWL LRG LVL3 (GOWN DISPOSABLE) ×1 IMPLANT
GOWN STRL REUS W/ TWL XL LVL3 (GOWN DISPOSABLE) ×2 IMPLANT
GOWN STRL REUS W/TWL 2XL LVL3 (GOWN DISPOSABLE) IMPLANT
HEMOSTAT POWDER KIT SURGIFOAM (HEMOSTASIS) ×1 IMPLANT
HEMOSTAT POWDER SURGIFOAM 1G (HEMOSTASIS) IMPLANT
KIT BASIN OR (CUSTOM PROCEDURE TRAY) ×1 IMPLANT
KIT GRAFTMAG DEL NEURO DISP (NEUROSURGERY SUPPLIES) IMPLANT
KIT POSITIONER JACKSON TABLE (MISCELLANEOUS) ×1 IMPLANT
KIT TURNOVER KIT B (KITS) ×1 IMPLANT
NDL HYPO 21X1.5 SAFETY (NEEDLE) ×1 IMPLANT
NDL HYPO 22X1.5 SAFETY MO (MISCELLANEOUS) ×1 IMPLANT
NEEDLE HYPO 21X1.5 SAFETY (NEEDLE) ×1 IMPLANT
NEEDLE HYPO 22X1.5 SAFETY MO (MISCELLANEOUS) ×1 IMPLANT
NS IRRIG 1000ML POUR BTL (IV SOLUTION) ×1 IMPLANT
PACK LAMINECTOMY NEURO (CUSTOM PROCEDURE TRAY) ×1 IMPLANT
PAD ARMBOARD POSITIONER FOAM (MISCELLANEOUS) ×3 IMPLANT
PATTIES SURGICAL .5 X1 (DISPOSABLE) IMPLANT
PUTTY DBM 10CC CALC GRAN (Putty) IMPLANT
ROD CURVED REVERE 6.35X90MM (Rod) IMPLANT
SCREW 7.5X50MM (Screw) IMPLANT
SPIKE FLUID TRANSFER (MISCELLANEOUS) ×1 IMPLANT
SPONGE NEURO XRAY DETECT 1X3 (DISPOSABLE) IMPLANT
SPONGE SURGIFOAM ABS GEL 100 (HEMOSTASIS) IMPLANT
SPONGE T-LAP 4X18 ~~LOC~~+RFID (SPONGE) IMPLANT
STRIP CLOSURE SKIN 1/2X4 (GAUZE/BANDAGES/DRESSINGS) ×1 IMPLANT
SUT VIC AB 1 CT1 18XBRD ANBCTR (SUTURE) ×2 IMPLANT
SUT VIC AB 2-0 CP2 18 (SUTURE) ×2 IMPLANT
SYR 20ML LL LF (SYRINGE) IMPLANT
TOWEL GREEN STERILE (TOWEL DISPOSABLE) ×1 IMPLANT
TOWEL GREEN STERILE FF (TOWEL DISPOSABLE) ×1 IMPLANT
TRAY FOLEY MTR SLVR 16FR STAT (SET/KITS/TRAYS/PACK) ×1 IMPLANT
WATER STERILE IRR 1000ML POUR (IV SOLUTION) ×1 IMPLANT

## 2023-12-04 NOTE — H&P (Signed)
 Subjective: The patient is a 73 year old white female on whom I performed a L3-4 and L4-5 fusion in 2017.  She has done well until recently when she developed recurrent back and bilateral leg pain, left greater than right.  She failed medical management and was worked up with a lumbar mild CT which demonstrated significant stenosis and small listhesis at L2-3.  I discussed the various treatment options with her.  She has decided proceed with surgery.  Past Medical History:  Diagnosis Date   Arthritis    Cataract    Cervical dysplasia    Frequent urination    Frequent urination at night    Hyperlipidemia    Spondylolisthesis of lumbar region    Wears hearing aid in both ears     Past Surgical History:  Procedure Laterality Date   Buninectomy Right 2005   CATARACT EXTRACTION Left 2012   CATARACT EXTRACTION W/PHACO Right 09/08/2019   Procedure: CATARACT EXTRACTION PHACO AND INTRAOCULAR LENS PLACEMENT (IOC) RIGHT 7.44  00:53.6;  Surgeon: Jaye Fallow, MD;  Location: [facility];  Service: Ophthalmology;  Laterality: Right;   COLONOSCOPY     Conezation  1983   ENDOSCOPIC VEIN LASER TREATMENT Right 12/2019   FOOT GANGLION EXCISION Left    2020s   HYSTERECTOMY ABDOMINAL WITH SALPINGECTOMY  2000   KNEE ARTHROSCOPY WITH MENISCAL REPAIR Right 11/10/2020   Procedure: RIGHT KNEE ARTHROSCOPY WITH LATERAL MENISCECTOMY;  Surgeon: Marchia Drivers, MD;  Location: [facility];  Service: Orthopedics;  Laterality: Right;   LUMBAR FUSION  2017   L3-4-5   removal milk duct Left 1985    No Known Allergies  Social History   Tobacco Use   Smoking status: Former    Current packs/day: 0.00    Types: Cigarettes    Start date: 61    Quit date: 1980    Years since quitting: 45.6   Smokeless tobacco: Never   Tobacco comments:    quit at age 27  Substance Use Topics   Alcohol use: Yes    Alcohol/week: 2.0 standard drinks of alcohol    Types: 2 Glasses of wine per week    Comment:  occassional wine    Family History  Problem Relation Age of Onset   Breast cancer Neg Hx    Prior to Admission medications   Medication Sig Start Date End Date Taking? Authorizing Provider  Black Pepper-Turmeric (TURMERIC COMPLEX/BLACK PEPPER PO) Take 1 tablet by mouth daily.   Yes [provider]  Boswellia-Glucosamine-Vit D (OSTEO BI-FLEX ONE PER DAY PO) Take 1 tablet by mouth daily.   Yes [provider]  Calcium Carb-Cholecalciferol (CALCIUM PLUS VITAMIN D3) 600-500 MG-UNIT CAPS Take 1 tablet by mouth daily.   Yes [provider]  carboxymethylcellulose (REFRESH PLUS) 0.5 % SOLN Place 1 drop into both eyes 3 (three) times daily as needed (dry eyes).   Yes [provider]  Cholecalciferol (VITAMIN D-3) 25 MCG (1000 UT) CAPS Take 1,000 Units by mouth daily.   Yes [provider]  Ginkgo Biloba 60 MG CAPS Take 60 mg by mouth daily.   Yes [provider]  magnesium oxide (MAG-OX) 400 MG tablet Take 400 mg by mouth daily.   Yes [provider]  metroNIDAZOLE (METROGEL) 0.75 % gel Apply 1 Application topically 2 (two) times daily. 05/29/23  Yes [provider]  Omega-3 Fatty Acids (FISH OIL ULTRA) 1400 MG CAPS Take 1,400 mg by mouth daily.   Yes [provider]  Potassium 99  MG TABS Take 99 mg by mouth daily.   Yes [provider]  Red Yeast Rice Extract (RED YEAST RICE PO) Take 1 capsule by mouth daily.   Yes [provider]  Zinc  50 MG TABS Take 50 mg by mouth daily.   Yes [provider]     Review of Systems  Positive ROS: As above  All other systems have been reviewed and were otherwise negative with the exception of those mentioned in the HPI and as above.  Objective: Vital signs in last 24 hours: Temp:  [97.9 F (36.6 C)] 97.9 F (36.6 C) (08/13 0837) Pulse Rate:  [62] 62 (08/13 0837) Resp:  [20] 20 (08/13 0837) BP: (152)/(90) 152/90 (08/13 0837) SpO2:  [98 %] 98 % (08/13  0837) Weight:  [66.1 kg] 66.1 kg (08/13 0837) Estimated body mass index is 25.03 kg/m as calculated from the following:   Height as of this encounter: 5' 4 (1.626 m).   Weight as of this encounter: 66.1 kg.   General Appearance: Alert Head: Normocephalic, without obvious abnormality, atraumatic Eyes: PERRL, conjunctiva/corneas clear, EOM's intact,    Ears: Normal  Throat: Normal  Neck: Her incision is well-healed. Back: Her incision is well-healed. Lungs: Clear to auscultation bilaterally, respirations unlabored Heart: Regular rate and rhythm, no murmur, rub or gallop Abdomen: Soft, non-tender Extremities: Extremities normal, atraumatic, no cyanosis or edema Skin: unremarkable  NEUROLOGIC:   Mental status: alert and oriented,Motor Exam - grossly normal Sensory Exam - grossly normal Reflexes:  Coordination - grossly normal Gait - grossly normal Balance - grossly normal Cranial Nerves: I: smell Not tested  II: visual acuity  OS: Normal  OD: Normal   II: visual fields Full to confrontation  II: pupils Equal, round, reactive to light  III,VII: ptosis None  III,IV,VI: extraocular muscles  Full ROM  V: mastication Normal  V: facial light touch sensation  Normal  V,VII: corneal reflex  Present  VII: facial muscle function - upper  Normal  VII: facial muscle function - lower Normal  VIII: hearing Not tested  IX: soft palate elevation  Normal  IX,X: gag reflex Present  XI: trapezius strength  5/5  XI: sternocleidomastoid strength 5/5  XI: neck flexion strength  5/5  XII: tongue strength  Normal    Data Review Lab Results  Component Value Date   WBC 7.1 11/20/2023   HGB 14.9 11/20/2023   HCT 45.7 11/20/2023   MCV 97.4 11/20/2023   PLT 194 11/20/2023   Lab Results  Component Value Date   NA 139 11/20/2023   K 4.2 11/20/2023   CL 106 11/20/2023   CO2 26 11/20/2023   BUN 22 11/20/2023   CREATININE 0.67 11/20/2023   GLUCOSE 108 (H) 11/20/2023   Lab Results   Component Value Date   INR 1.0 11/09/2020    Assessment/Plan: Adjacent segment disease with spondylolisthesis, lumbar spinal stenosis, neurogenic claudication, lumbago: I discussed the situation with the patient.  I have reviewed her imaging studies with her and pointed out the abnormalities.  We have discussed the various treatment options including surgery.  I described the surgical treatment option of an exploration of her lumbar fusion with an L2-3 decompression, instrumentation and fusion.  I have shown her surgical models.  I have given her a surgical pamphlet.  We have discussed the risk, benefits, alternatives, expected postop course, and likelihood of achieving her goals with surgery.  I have answered all her questions.  She has decided proceed with surgery.  Reyes JONETTA Budge 12/04/2023 10:03 AM

## 2023-12-04 NOTE — Progress Notes (Signed)
 Orthopedic Tech Progress Note Patient Details:  Angela Walters 09-19-1950 [id number] LSO brace was delivered to patient's room on 5N.  Ortho Devices Type of Ortho Device: Lumbar corsett Ortho Device/Splint Interventions: Ordered      Angela Walters Angela Walters 12/04/2023, 4:07 PM

## 2023-12-04 NOTE — Anesthesia Procedure Notes (Addendum)
 Procedure Name: Intubation Date/Time: 12/04/2023 11:09 AM  Performed by: Worth Peppers, CRNAPre-anesthesia Checklist: Patient identified, Emergency Drugs available, Suction available and Patient being monitored Patient Re-evaluated:Patient Re-evaluated prior to induction Oxygen Delivery Method: Circle System Utilized Preoxygenation: Pre-oxygenation with 100% oxygen Induction Type: IV induction Ventilation: Mask ventilation without difficulty Laryngoscope Size: Mac and 3 Grade View: Grade I Tube type: Oral Tube size: 7.0 mm Number of attempts: 1 Airway Equipment and Method: Stylet and Oral airway Placement Confirmation: ETT inserted through vocal cords under direct vision, positive ETCO2 and breath sounds checked- equal and bilateral Secured at: 22 cm Tube secured with: Tape Dental Injury: Teeth and Oropharynx as per pre-operative assessment

## 2023-12-04 NOTE — Transfer of Care (Cosign Needed)
 Immediate Anesthesia Transfer of Care Note  Patient: Angela Walters  Procedure(s) Performed: POSTERIOR LUMBAR TWO-THREE FUSION WITH EXTENSION TO PREVIOUS FUSION  Patient Location: PACU  Anesthesia Type:General  Level of Consciousness: drowsy  Airway & Oxygen Therapy: Patient Spontanous Breathing and Patient connected to face mask oxygen  Post-op Assessment: Report given to RN and Post -op Vital signs reviewed and stable  Post vital signs: Reviewed  Last Vitals:  Vitals Value Taken Time  BP 132/69 12/04/23 15:08  Temp    Pulse 77 12/04/23 15:15  Resp 16 12/04/23 15:15  SpO2 96 % 12/04/23 15:15  Vitals shown include unfiled device data.  Last Pain:  Vitals:   12/04/23 0902  TempSrc:   PainSc: 0-No pain         Complications: There were no known notable events for this encounter.

## 2023-12-04 NOTE — Anesthesia Preprocedure Evaluation (Signed)
 Anesthesia Evaluation  Patient identified by MRN, date of birth, ID band Patient awake    Reviewed: Allergy & Precautions, NPO status , Patient's Chart, lab work & pertinent test results  Airway Mallampati: II  TM Distance: >3 FB Neck ROM: Full    Dental no notable dental hx.    Pulmonary neg pulmonary ROS, former smoker   Pulmonary exam normal        Cardiovascular negative cardio ROS  Rhythm:Regular Rate:Normal     Neuro/Psych negative neurological ROS  negative psych ROS   GI/Hepatic negative GI ROS, Neg liver ROS,,,  Endo/Other  negative endocrine ROS    Renal/GU negative Renal ROS  negative genitourinary   Musculoskeletal  (+) Arthritis , Osteoarthritis,    Abdominal Normal abdominal exam  (+)   Peds  Hematology Lab Results      Component                Value               Date                      WBC                      7.1                 11/20/2023                HGB                      14.9                11/20/2023                HCT                      45.7                11/20/2023                MCV                      97.4                11/20/2023                PLT                      194                 11/20/2023             Lab Results      Component                Value               Date                      NA                       139                 11/20/2023                K  4.2                 11/20/2023                CO2                      26                  11/20/2023                GLUCOSE                  108 (H)             11/20/2023                BUN                      22                  11/20/2023                CREATININE               0.67                11/20/2023                CALCIUM                  9.2                 11/20/2023                GFRNONAA                 >60                 11/20/2023              Anesthesia Other  Findings   Reproductive/Obstetrics                              Anesthesia Physical Anesthesia Plan  ASA: 3  Anesthesia Plan: General   Post-op Pain Management: Ofirmev  IV (intra-op)*   Induction: Intravenous  PONV Risk Score and Plan: 3 and Ondansetron , Dexamethasone  and Treatment may vary due to age or medical condition  Airway Management Planned: Mask and Oral ETT  Additional Equipment: None  Intra-op Plan:   Post-operative Plan: Extubation in OR  Informed Consent: I have reviewed the patients History and Physical, chart, labs and discussed the procedure including the risks, benefits and alternatives for the proposed anesthesia with the patient or authorized representative who has indicated his/her understanding and acceptance.     Dental advisory given  Plan Discussed with: CRNA  Anesthesia Plan Comments:          Anesthesia Quick Evaluation

## 2023-12-04 NOTE — Anesthesia Postprocedure Evaluation (Signed)
 Anesthesia Post Note  Patient: Angela Walters  Procedure(s) Performed: POSTERIOR LUMBAR TWO-THREE FUSION WITH EXTENSION TO PREVIOUS FUSION     Patient location during evaluation: PACU Anesthesia Type: General Level of consciousness: awake and alert Pain management: pain level controlled Vital Signs Assessment: post-procedure vital signs reviewed and stable Respiratory status: spontaneous breathing, nonlabored ventilation, respiratory function stable and patient connected to nasal cannula oxygen Cardiovascular status: blood pressure returned to baseline and stable Postop Assessment: no apparent nausea or vomiting Anesthetic complications: no   There were no known notable events for this encounter.  Last Vitals:  Vitals:   12/04/23 1615 12/04/23 1651  BP: (!) 153/68 (!) 140/59  Pulse: 72 68  Resp: 19 18  Temp:  36.4 C  SpO2: 99% 92%    Last Pain:  Vitals:   12/04/23 1651  TempSrc: Oral  PainSc:                  [name]

## 2023-12-04 NOTE — Op Note (Signed)
 Brief history: The patient is a 73 year old white female on whom I performed an L3-4 and L4-5 instrumented fusion in 2017.  She has done well until recently when she developed back and left greater right leg pain.  She has failed medical management and was worked up with a lumbar mild CT which demonstrated significant adjacent segment disease.  I discussed the various treatment options with her.  She has decided to proceed with surgery.  Preoperative diagnosis: L2-3 adjacent segment disease with spondylolisthesis, degenerative disc disease, spinal stenosis compressing both the L2 and the L3 nerve roots; lumbago; lumbar radiculopathy; neurogenic claudication  Postoperative diagnosis: The same  Procedure: Bilateral L2-3 laminotomy/foraminotomies/medial facetectomy to decompress the bilateral L2 and L3 nerve roots(the work required to do this was in addition to the work required to do the posterior lumbar interbody fusion because of the patient's spinal stenosis, facet arthropathy. Etc. requiring a wide decompression of the nerve roots.); left L2-3 transforaminal lumbar interbody fusion with local morselized autograft bone and Zimmer DBM; insertion of interbody prosthesis at L2-3 (globus peek expandable interbody prosthesis); posterior segmental instrumentation from L2 to L5 with globus titanium pedicle screws and rods; posterior lateral arthrodesis at L2-3 with local morselized autograft bone and Zimmer DBM; exploration of lumbar fusion/removal of lumbar hardware.  Surgeon: Dr. Chyrl Budge  Asst.: Dr. Dorn Glade and Duwaine Beck, NP  Anesthesia: Gen. endotracheal  Estimated blood loss: 200 cc  Drains: Medium Hemovac drain in the epidural space  Complications: None  Description of procedure: The patient was brought to the operating room by the anesthesia team. General endotracheal anesthesia was induced. The patient was turned to the prone position on the Wilson frame. The patient's  lumbosacral region was then prepared with Betadine scrub and Betadine solution. Sterile drapes were applied.  I then injected the area to be incised with Marcaine  with epinephrine  solution. I then used the scalpel to make a linear midline incision over the L2-3, L3-4 and L4-5 interspace, incising through the old surgical scar. I then used electrocautery to perform a bilateral subperiosteal dissection exposing the spinous process and lamina of L2-3, L3-4 and L4-5 and exposing the old hardware at L3-4 and L4-5.  We then inserted the Verstrac retractor to provide exposure.  We explored the fusion by removing the cath from the old rods and then removing the rods.  I inspected arthrodesis at L3-4 and L4-5.  It appeared solid.  I began the decompression by using the high speed drill to perform laminotomies at L2-3 bilaterally. We then used the Kerrison punches to widen the laminotomy and removed the ligamentum flavum at L2-3 bilaterally. We used the Kerrison punches to remove the medial facets at L2-3 bilaterally, I removed the left L2-3 facet. We performed wide foraminotomies about the bilateral L2 and L3 nerve roots completing the decompression.  We now turned our attention to the posterior lumbar interbody fusion. I used a scalpel to incise the intervertebral disc at L2-3 bilaterally. I then performed a partial intervertebral discectomy at L2-3 bilaterally using the pituitary forceps. We prepared the vertebral endplates at L2-3 bilaterally for the fusion by removing the soft tissues with the curettes. We then used the trial spacers to pick the appropriate sized interbody prosthesis. We prefilled his prosthesis with a combination of local morselized autograft bone that we obtained during the decompression as well as Zimmer DBM. We inserted the prefilled prosthesis into the interspace at L2-3 from the left, we then turned and expanded the prosthesis. There was a good  snug fit of the prosthesis in the interspace.  We then filled and the remainder of the intervertebral disc space with local morselized autograft bone and Zimmer DBM. This completed the posterior lumbar interbody arthrodesis.  During the decompression and insertion of the prosthesis the assistant protected the thecal sac and nerve roots with the D'Errico retractor.  We now turned attention to the instrumentation. Under fluoroscopic guidance we cannulated the bilateral L2 pedicles with the bone probe. We then removed the bone probe. We then tapped the pedicle with a 6.5 millimeter tap. We then removed the tap. We probed inside the tapped pedicle with a ball probe to rule out cortical breaches. We then inserted a 7.5 x 50 millimeter pedicle screw into the L2 pedicles bilaterally under fluoroscopic guidance. We then palpated along the medial aspect of the pedicles to rule out cortical breaches. There were none. The nerve roots were not injured. We then connected the unilateral pedicle screws with a lordotic rod. We compressed the construct and secured the rod in place with the caps. We then tightened the caps appropriately. This completed the instrumentation from L2-L5 bilaterally.  We now turned our attention to the posterior lateral arthrodesis at L2-3. We used the high-speed drill to decorticate the remainder of the facets, pars, transverse process at L2-3. We then applied a combination of local morselized autograft bone and Zimmer DBM over these decorticated posterior lateral structures. This completed the posterior lateral arthrodesis.  We then obtained hemostasis using bipolar electrocautery. We irrigated the wound out with vashe solution. We inspected the thecal sac and nerve roots and noted they were well decompressed. We then removed the retractor.  We injected Exparel  .  We placed a Hemovac drain in the epidural space and tunneled it out through a separate stab wound.  We reapproximated patient's thoracolumbar fascia with interrupted #1 Vicryl  suture. We reapproximated patient's subcutaneous tissue with interrupted 2-0 Vicryl suture. The reapproximated patient's skin with Steri-Strips and benzoin. The wound was then coated with bacitracin  ointment. A sterile dressing was applied. The drapes were removed. The patient was subsequently returned to the supine position where they were extubated by the anesthesia team. He was then transported to the post anesthesia care unit in stable condition. All sponge instrument and needle counts were reportedly correct at the end of this case.

## 2023-12-05 DIAGNOSIS — M5136 Other intervertebral disc degeneration, lumbar region with discogenic back pain only: Secondary | ICD-10-CM | POA: Diagnosis not present

## 2023-12-05 MED ORDER — DEXAMETHASONE SODIUM PHOSPHATE 10 MG/ML IJ SOLN
10.0000 mg | Freq: Four times a day (QID) | INTRAMUSCULAR | Status: AC
  Administered 2023-12-05 (×3): 10 mg via INTRAVENOUS
  Filled 2023-12-05 (×3): qty 1

## 2023-12-05 MED ORDER — GABAPENTIN 300 MG PO CAPS
300.0000 mg | ORAL_CAPSULE | Freq: Three times a day (TID) | ORAL | Status: DC
  Administered 2023-12-05 – 2023-12-06 (×4): 300 mg via ORAL
  Filled 2023-12-05 (×4): qty 1

## 2023-12-05 NOTE — TOC Initial Note (Signed)
 Transition of Care [facility]) - Initial/Assessment Note    Patient Details  Name: Angela Walters MRN: [id number] Date of Birth: Jul 14, 1950  Transition of Care [facility]) CM/SW Contact:    Rosalva Jon Bloch, RN Phone Number: 12/05/2023, 9:44 AM  Clinical Narrative:                   - s/p  Bilateral L2-3 laminotomy/foraminotomies/medial facetectomy, 8/13  NCM @ bedside to speak with pt regarding d/c planning. Pt states from home with family. States daughters to assist with care once home. Pt without DME needs. Pt without RX med concerns or transportation issues.  PCP confirmed , Abagail Leventhal MD.  PT/OT evaluations pending...  TOC team following and will assist with needs as presents.  Expected Discharge Plan: Home/Self Care Barriers to Discharge: Continued Medical Work up   Patient Goals and CMS Choice            Expected Discharge Plan and Services   Discharge Planning Services: CM Consult                                          Prior Living Arrangements/Services   Lives with:: Adult Children Patient language and need for interpreter reviewed:: Yes Do you feel safe going back to the place where you live?: Yes      Need for Family Participation in Patient Care: Yes (Comment) Care giver support system in place?: Yes (comment) Current home services: DME (RW, chair lift) Criminal Activity/Legal Involvement Pertinent to Current Situation/Hospitalization: No - Comment as needed  Activities of Daily Living      Permission Sought/Granted                  Emotional Assessment       Orientation: : Oriented to Self, Oriented to Place, Oriented to  Time, Oriented to Situation Alcohol / Substance Use: Not Applicable Psych Involvement: No (comment)  Admission diagnosis:  Spondylolisthesis, lumbar region [M43.16] Lumbar adjacent segment disease with spondylolisthesis [M51.369, M43.16] Patient Active Problem List   Diagnosis Date Noted   Lumbar  adjacent segment disease with spondylolisthesis 12/04/2023   History of lumbar fusion 07/24/2023   Neck pain 05/21/2023   Ganglion cyst of left foot 09/08/2021   Pain in right knee 11/18/2020   Stiffness of right knee 11/18/2020   Impingement syndrome of left shoulder region 06/20/2020   Varicose veins of right lower extremity with inflammation 10/19/2019   Cataract (lens) fragments in eye following cataract surgery, right eye 09/08/2019   Spondylolisthesis of lumbar region 10/31/2015   Chronic low back pain 03/01/2015   Spinal stenosis of lumbar region 03/01/2015   Hormone replacement therapy (HRT) 07/03/2013   Hyperlipidemia 07/03/2013   PCP:  Leventhal Manna, MD Pharmacy:   [facility] [address] [place] [address] Phone: [phone] Fax: [phone]     Social Drivers of Health (SDOH) Social History: SDOH Screenings   Food Insecurity: No Food Insecurity (10/24/2023)   Received from [facility]  Housing: Low Risk  (10/24/2023)   Received from [facility]  Transportation Needs: No Transportation Needs (10/24/2023)   Received from [facility]  Utilities: Not At Risk (10/24/2023)   Received from [facility]  Financial Resource Strain: Low Risk  (10/24/2023)   Received from [facility]  Tobacco Use:  Medium Risk (12/04/2023)   SDOH Interventions:     Readmission Risk Interventions     No data to display

## 2023-12-05 NOTE — Progress Notes (Signed)
 Subjective: The patient is alert and pleasant.  He complains of left leg pain.  Physical therapy has worked with her.  Her drain was pulled out and does not hold the suction.  She still has her Foley catheter in place.  Objective: Vital signs in last 24 hours: Temp:  [97.6 F (36.4 C)-97.9 F (36.6 C)] 97.9 F (36.6 C) (08/14 0748) Pulse Rate:  [66-82] 71 (08/14 0748) Resp:  [10-23] 16 (08/14 0748) BP: (125-153)/(45-87) 151/64 (08/14 0748) SpO2:  [92 %-100 %] 100 % (08/14 0748) Estimated body mass index is 25.03 kg/m as calculated from the following:   Height as of this encounter: 5' 4 (1.626 m).   Weight as of this encounter: 66.1 kg.   Intake/Output from previous day: 08/13 0701 - 08/14 0700 In: 1500 [I.V.:1100; IV Piggyback:400] Out: 1835 [Urine:1735; Drains:50; Blood:50] Intake/Output this shift: No intake/output data recorded.  Physical exam the patient is alert and pleasant.  Her strength is normal.  Lab Results: No results for input(s): WBC, HGB, HCT, PLT in the last 72 hours. BMET No results for input(s): NA, K, CL, CO2, GLUCOSE, BUN, CREATININE, CALCIUM in the last 72 hours.  Studies/Results: DG Lumbar Spine 2-3 Views Result Date: 12/04/2023 CLINICAL DATA:  Elective surgery. EXAM: LUMBAR SPINE - 2-3 VIEW COMPARISON:  CT 10/11/2023 FINDINGS: Two fluoroscopic spot views of the lumbar spine submitted from the operating room. Previous lower lumbar fusion. There are new pedicle screws at L2 with L2-L3 interbody spacer. Fluoroscopy time 13 seconds. Dose 10.45 mGy. IMPRESSION: Intraoperative fluoroscopy during lumbar fusion. Electronically Signed   By: Andrea Gasman M.D.   On: 12/04/2023 15:17    Assessment/Plan: Postop day #1: We will remove her drain as it has been pulled out and will not hold the suction.  We will discharge her Foley per the standard orders.  I will add Decadron  and Neurontin  for her leg pain.  She is contemplating going home  later on today.  I gave her her discharge instructions and answered all her questions.  LOS: 0 days     Reyes JONETTA Budge 12/05/2023, 8:58 AM     Patient ID: Ronal ONEIDA Barrack, female   DOB: [birth year], 73 y.o.   MRN: [id number]

## 2023-12-05 NOTE — Evaluation (Signed)
 Occupational Therapy Evaluation Patient Details Name: Angela Walters MRN: [id number] DOB: 08-30-1950 Today's Date: 12/05/2023   History of Present Illness   Angela Walters is a 73 y.o. female who presented 12/04/23 for an exploration of her lumbar fusion with an L2-3 decompression, instrumentation and fusion. PMHx: arthritis, cataract, cervical dysplasia, HLD, spondylolisthesis of lumbar region, and prior L3-4/L4-5 fusion in 2017.     Clinical Impressions At baseline, pt is Independent with ADLs, IADLs, and functional mobility without an AD. At baseline, pt drives and is active in the community. Pt now presents with decreased activity tolerance, decreased knowledge of precautions, decreased knowledge of AE/DME, mildly decreased balance, mildly decreased cognition and short-term memory, pain affecting functional level, and decreased safety and independence with functional tasks. Pt currently demonstrates ability to complete ADLs largely Independent to Min assist and functional mobility/transfers with a RW with Supervision. Pt also demonstrates ability to Independently state 3/3 back precautions, but requires cues to adhere to back precautions throughout functional tasks. Pt will benefit from acute skilled OT services to address deficits outlined below and to increase safety and independence with functional tasks while adhering to back precautions. Pt has good family support and daughters will be staying with her post-discharge to provide needed assistance. No post acute skilled OT needs are anticipated at this time.      If plan is discharge home, recommend the following:         Functional Status Assessment   Patient has had a recent decline in their functional status and demonstrates the ability to make significant improvements in function in a reasonable and predictable amount of time.     Equipment Recommendations   None recommended by OT (Pt already has needed equipment)      Recommendations for Other Services         Precautions/Restrictions   Precautions Precautions: Back;Fall Precaution Booklet Issued: Yes (comment) (provided in prior PT session) Recall of Precautions/Restrictions: Impaired Precaution/Restrictions Comments: Re-educated pt on her back precautions. She recalled 3/3 when assessed later. Pt required cues to adhere to precautions throughout functional mobility and to problem solve how to adapt tasks to adhere to precautions. Required Braces or Orthoses: Spinal Brace Spinal Brace: Lumbar corset;Applied in sitting position (May remove when in bed; May ambulate to bathroom without brace; May apply/remove brace while sitting; May remove brace to shower) Restrictions Other Position/Activity Restrictions: back precautions     Mobility Bed Mobility Overal bed mobility: Needs Assistance Bed Mobility: Sidelying to Sit Rolling: Supervision, Used rails         General bed mobility comments: Educated pt on log roll technique and use of rails to stabilize but not pull on. No physical assist. Cues throughout for proper sequencing. Supervision for safety.    Transfers Overall transfer level: Needs assistance Equipment used: Rolling walker (2 wheels) Transfers: Sit to/from Stand, Bed to chair/wheelchair/BSC Sit to Stand: Supervision     Step pivot transfers: Supervision            Balance Overall balance assessment: Mild deficits observed, not formally tested                                         ADL either performed or assessed with clinical judgement   ADL Overall ADL's : Needs assistance/impaired Eating/Feeding: Independent;Sitting   Grooming: Supervision/safety;Standing;Cueing for compensatory techniques (cues to adhere to back precautions)   Upper  Body Bathing: Contact guard assist;Cueing for compensatory techniques;Sitting (cues to adhere to back precautions)   Lower Body Bathing: Minimal assistance;Sit  to/from stand;Sitting/lateral leans;Cueing for compensatory techniques;Cueing for back precautions   Upper Body Dressing : Set up;Supervision/safety;Cueing for compensatory techniques;Sitting (cues to adhere to back precautions)   Lower Body Dressing: Supervision/safety;Cueing for compensatory techniques;Cueing for back precautions;Sitting/lateral leans;Sit to/from stand   Toilet Transfer: Supervision/safety;Ambulation;Rollator (4 wheels);Regular Toilet (cues to adhere to back precautions)   Toileting- Clothing Manipulation and Hygiene: Supervision/safety;Contact guard assist;Cueing for compensatory techniques;Cueing for back precautions;Sitting/lateral lean;Sit to/from stand       Functional mobility during ADLs: Supervision/safety;Rolling walker (2 wheels);Cueing for safety (Cues to maintain B grip on RW and keep RW on the floor during ambulation/turning; cues to adhere to back precautions) General ADL Comments: Pt able to donn/doff back brace in sitting with Supervision. Pt would benefit from use of a AE for LB ADLs with pt reporting she has a longhandled sponge and reacher at home. OT initiated education in use of AE for completing ADLs while adhering to back precautions with pt verbalizing understanding. Pt will benefit from reinforcement of education.     Vision Baseline Vision/History: 1 Wears glasses (readers) Ability to See in Adequate Light: 0 Adequate Patient Visual Report: No change from baseline Vision Assessment?: No apparent visual deficits Additional Comments: Vision [facility] for tasks assessed; not formally screened or evaluated     Perception         Praxis         Pertinent Vitals/Pain Pain Assessment Pain Assessment: 0-10 Pain Score: 5  Pain Location: Back and L lateral thigh Pain Descriptors / Indicators: Operative site guarding, Grimacing, Discomfort, Aching, Burning, Cramping Pain Intervention(s): Limited activity within patient's tolerance, Monitored during  session, Repositioned     Extremity/Trunk Assessment Upper Extremity Assessment Upper Extremity Assessment: Right hand dominant;Overall [facility] for tasks assessed   Lower Extremity Assessment Lower Extremity Assessment: Defer to PT evaluation RLE Deficits / Details: AROM WFL. Hip flexion 3+/5 with pain noted, Knee flex/ext 4/5, Ankle DF/PF 4/5. RLE: Unable to fully assess due to pain RLE Sensation: WNL RLE Coordination: WNL LLE Deficits / Details: AROM WFL. Hip flexion 4/5, Knee flex/ext 4/5 with pain noted, Ankle DF/PF 4/5. LLE: Unable to fully assess due to pain LLE Sensation: decreased light touch;decreased proprioception LLE Coordination: WNL   Cervical / Trunk Assessment Cervical / Trunk Assessment: Back Surgery   Communication Communication Communication: No apparent difficulties;Other (comment) Factors Affecting Communication: Hearing impaired (wearing hearing aids during session)   Cognition Arousal: Alert Behavior During Therapy: WFL for tasks assessed/performed Cognition: Cognition impaired, No family/caregiver present to determine baseline     Awareness: Intellectual awareness intact, Online awareness intact (intermittent online awareness) Memory impairment (select all impairments): Short-term memory Attention impairment (select first level of impairment): Divided attention Executive functioning impairment (select all impairments): Problem solving OT - Cognition Comments: Pt AAOx4 and pleasant throughout session. Pt cognition largely [facility] for tasks assessed, but with deficits noted above. Pt also requiring cues to adhere to back precautions during tasks and cues to problem solve how to adapt tasks in order to adhere to back precautions.                 Following commands: Intact       Cueing  General Comments   Cueing Techniques: Verbal cues;Gestural cues  VSS on RA   Exercises     Shoulder Instructions      Home Living Family/patient expects to be  discharged to:: Private residence Living Arrangements: Alone Available Help at Discharge: Family;Available 24 hours/day (Pt reports her daughters have figured out a schedule to stay with her full-time as she recovers.) Type of Home: House Home Access: Stairs to enter [employer] of Steps: 7 Entrance Stairs-Rails: Right;Left Home Layout: Two level;Able to live on main level with bedroom/bathroom;Full bath on main level Alternate Level Stairs-Number of Steps: flight   Bathroom Shower/Tub: Chief Strategy Officer: Standard     Home Equipment: Grab bars - tub/shower;Shower seat;Grab bars - toilet;Rolling Walker (2 wheels);Hand held shower head;Adaptive equipment;Other (comment) (bed transfer handle) Adaptive Equipment: Reacher;Long-handled sponge        Prior Functioning/Environment Prior Level of Function : Independent/Modified Independent;History of Falls (last six months);Driving             Mobility Comments: Indep. Ambulates without AD. Pt has an active lifestyle including various classes such as pilates, weight lifting, and line dancing. She goes on daily walks. 1 fall in the past 6mo, slipping on the beach. ADLs Comments: Indep with ADL/IADLs. Retired. Driving.    OT Problem List: Decreased activity tolerance;Impaired balance (sitting and/or standing);Decreased safety awareness;Decreased cognition;Decreased knowledge of use of DME or AE;Decreased knowledge of precautions;Pain   OT Treatment/Interventions: Self-care/ADL training;Energy conservation;DME and/or AE instruction;Therapeutic activities;Cognitive remediation/compensation;Patient/family education;Balance training      OT Goals(Current goals can be found in the care plan section)   Acute Rehab OT Goals Patient Stated Goal: to heal well, return home, and stay active OT Goal Formulation: With patient Time For Goal Achievement: 12/19/23 Potential to Achieve Goals: Good ADL Goals Pt Will  Perform Lower Body Bathing: with modified independence;with adaptive equipment;sitting/lateral leans;sit to/from stand (adhering to back precautions) Pt Will Perform Lower Body Dressing: with modified independence;with adaptive equipment;sitting/lateral leans;sit to/from stand (adhering to back precautions) Pt Will Transfer to Toilet: with modified independence;ambulating;regular height toilet (adhering to back precautions; with least restrictive AD) Pt Will Perform Toileting - Clothing Manipulation and hygiene: with modified independence;sitting/lateral leans;sit to/from stand (adhering to back precautions; with adaptive equipment as needed)   OT Frequency:  Min 2X/week    Co-evaluation              AM-PAC OT 6 Clicks Daily Activity     Outcome Measure Help from another person eating meals?: None Help from another person taking care of personal grooming?: A Little Help from another person toileting, which includes using toliet, bedpan, or urinal?: A Little Help from another person bathing (including washing, rinsing, drying)?: A Little Help from another person to put on and taking off regular upper body clothing?: A Little Help from another person to put on and taking off regular lower body clothing?: A Little 6 Click Score: 19   End of Session Equipment Utilized During Treatment: Rolling walker (2 wheels);Back brace Nurse Communication: Mobility status  Activity Tolerance: Patient tolerated treatment well Patient left: in chair;with call bell/phone within reach;with chair alarm set  OT Visit Diagnosis: Unsteadiness on feet (R26.81);Pain;Other symptoms and signs involving cognitive function                Time: 8795-8757 OT Time Calculation (min): 38 min Charges:  OT General Charges $OT Visit: 1 Visit OT Evaluation $OT Eval Moderate Complexity: 1 Mod OT Treatments $Self Care/Home Management : 23-37 mins  Margarie Rockey HERO., OTR/L, MA Acute Rehab [phone]   Margarie FORBES Horns 12/05/2023, 2:19 PM

## 2023-12-05 NOTE — Evaluation (Signed)
 Physical Therapy One-Time Evaluation Patient Details Name: Angela Walters MRN: [id number] DOB: [birth year] Today's Date: 12/05/2023  History of Present Illness  Angela Walters is a 73 y.o. female who presented 12/04/23 for an exploration of her lumbar fusion with an L2-3 decompression, instrumentation and fusion. PMHx: arthritis, cataract, cervical dysplasia, HLD, spondylolisthesis of lumbar region, and prior L3-4/L4-5 fusion in 2017.   Clinical Impression  Pt admitted with above diagnosis. PTA, pt was independent with functional mobility, ADLs/IADLs, and driving. She lives alone in a two story house with 7 STE and railing. Pt is able to reside on the main level. She reports her daughters will be coordinating staying with her as she recovers. Pt currently with functional limitations due to the deficits listed below (see PT Problem List). She performed bed mobility using the log roll technique with supervision, transfers with/without RW with supervision, gait using RW with supervision, and stairs using handrail with supervision. Introduced RW for increased support and stabilize secondary to pt's c/o LE pain and weakness. Educated her on proper and safe us  of AD. She ambulated ~568ft with a reciprocal gait pattern. Pt ascended/descended a flight of stairs alternating steps with handrail. She was able to state 3/3 back precautions, but required cues to adhere to restrictions during functional mobility. Recommend OPPT to decrease pain, increase ROM/strength, improve flexibility, decrease fall risk, and optimize safety and independence with functional mobility.     If plan is discharge home, recommend the following: A little help with walking and/or transfers;A little help with bathing/dressing/bathroom;Help with stairs or ramp for entrance   Can travel by private vehicle        Equipment Recommendations None recommended by PT (Pt already has DME)  Recommendations for Other Services  OT consult     Functional Status Assessment Patient has had a recent decline in their functional status and demonstrates the ability to make significant improvements in function in a reasonable and predictable amount of time.     Precautions / Restrictions Precautions Precautions: Back;Fall Precaution Booklet Issued: Yes (comment) Recall of Precautions/Restrictions: Impaired Precaution/Restrictions Comments: Educated pt on her back precautions. Provided her with handout. She recalled 3/3 when assessed later. Pt required cues to adhere to precautions throughout funcitonal mobility. Required Braces or Orthoses: Spinal Brace Spinal Brace: Lumbar corset;Applied in sitting position (May remove when in bed; May ambulate to bathroom without brace; May apply/remove brace while sitting; May remove brace to shower) Restrictions Weight Bearing Restrictions Per Provider Order: No      Mobility  Bed Mobility Overal bed mobility: Needs Assistance Bed Mobility: Rolling, Sidelying to Sit Rolling: Used rails, Supervision Sidelying to sit: Used rails, Supervision       General bed mobility comments: Educated pt on log roll technique. No physical assist. Cues throughout for proper sequencing. Supervision for safety.    Transfers Overall transfer level: Needs assistance Equipment used: None, Rolling walker (2 wheels) Transfers: Sit to/from Stand, Bed to chair/wheelchair/BSC Sit to Stand: Supervision   Step pivot transfers: Supervision       General transfer comment: Pt stood from lowest bed height and recliner chair. Initially without AD, but pt c/o pain and weakness in LEs. Introduced RW and cued pt on proper hand placement. She stood without physical assist. Supervision for safety. Good eccentric control.    Ambulation/Gait Ambulation/Gait assistance: Supervision Gait Distance (Feet): 500 Feet Assistive device: Rolling walker (2 wheels) Gait Pattern/deviations: Step-through pattern, Decreased stride  length Gait velocity: WFL Gait velocity interpretation: 1.31 - 2.62  ft/sec, indicative of limited community ambulator   General Gait Details: Educated pt on gait technique using RW. She ambulated with a reciprocal gait pattern, even weight shift, and good foot clearence. Cues for proximity to RW. Pt intermittently lifted AD off floor. Instructed her to maintain all four points in contact with the ground at all times. No LOB  Stairs Stairs: Yes Stairs assistance: Supervision Stair Management: One rail Left, Alternating pattern, Forwards Number of Stairs: 10 General stair comments: Pt ascended/descended with an alternating gait pattern and unilateral UE support on RW. She manuevered well without rest breaks and no LOB.  Wheelchair Mobility     Tilt Bed    Modified Rankin (Stroke Patients Only)       Balance Overall balance assessment: Mild deficits observed, not formally tested                                           Pertinent Vitals/Pain Pain Assessment Pain Assessment: 0-10 Pain Score: 6  Pain Location: Back and B lateral thigh Pain Descriptors / Indicators: Operative site guarding, Grimacing, Discomfort, Aching, Burning Pain Intervention(s): Monitored during session, Limited activity within patient's tolerance, Repositioned, Patient requesting pain meds-RN notified    Home Living Family/patient expects to be discharged to:: Private residence Living Arrangements: Alone Available Help at Discharge: Family;Available 24 hours/day (Pt reports her daughters have figured out a schedule to stay with her full-time as she recovers.) Type of Home: House Home Access: Stairs to enter Entrance Stairs-Rails: Doctor, general practice of Steps: 7   Home Layout: Two level;Able to live on main level with bedroom/bathroom;Full bath on main level Home Equipment: Grab bars - tub/shower;Shower seat;Grab bars - toilet;Rolling Walker (2 wheels)      Prior  Function Prior Level of Function : Independent/Modified Independent;History of Falls (last six months);Driving             Mobility Comments: Indep. Ambulates without AD. Pt has an active lifestyle including various classes such as pilates, weight lifting, and line dancing. She goes on daily walks. 1 fall in the past 29mo, slipping on the beach. ADLs Comments: Indep with ADL/IADLs. Retired. Driving.     Extremity/Trunk Assessment   Upper Extremity Assessment Upper Extremity Assessment: Defer to OT evaluation    Lower Extremity Assessment Lower Extremity Assessment: RLE deficits/detail;LLE deficits/detail RLE Deficits / Details: AROM WFL. Hip flexion 3+/5 with pain noted, Knee flex/ext 4/5, Ankle DF/PF 4/5. RLE: Unable to fully assess due to pain RLE Sensation: WNL RLE Coordination: WNL LLE Deficits / Details: AROM WFL. Hip flexion 4/5, Knee flex/ext 4/5 with pain noted, Ankle DF/PF 4/5. LLE: Unable to fully assess due to pain LLE Sensation: decreased light touch;decreased proprioception LLE Coordination: WNL    Cervical / Trunk Assessment Cervical / Trunk Assessment: Back Surgery  Communication   Communication Communication: No apparent difficulties    Cognition Arousal: Alert Behavior During Therapy: WFL for tasks assessed/performed   PT - Cognitive impairments: No apparent impairments                       PT - Cognition Comments: Pt A,Ox4 Following commands: Intact       Cueing Cueing Techniques: Verbal cues, Gestural cues     General Comments General comments (skin integrity, edema, etc.): Pt's back dressing was saturated. Called RN to reinforce. Pt's hemovac doesn't appear to be suctioning,  RN reported she would update MD.    Exercises     Assessment/Plan    PT Assessment All further PT needs can be met in the next venue of care  PT Problem List Decreased strength;Decreased balance;Decreased mobility;Decreased coordination;Decreased knowledge of  use of DME;Decreased safety awareness;Decreased knowledge of precautions       PT Treatment Interventions      PT Goals (Current goals can be found in the Care Plan section)  Acute Rehab PT Goals Patient Stated Goal: Return Home    Frequency       Co-evaluation               AM-PAC PT 6 Clicks Mobility  Outcome Measure Help needed turning from your back to your side while in a flat bed without using bedrails?: A Little Help needed moving from lying on your back to sitting on the side of a flat bed without using bedrails?: A Little Help needed moving to and from a bed to a chair (including a wheelchair)?: A Little Help needed standing up from a chair using your arms (e.g., wheelchair or bedside chair)?: A Little Help needed to walk in hospital room?: A Little Help needed climbing 3-5 steps with a railing? : A Little 6 Click Score: 18    End of Session Equipment Utilized During Treatment: Back brace Activity Tolerance: Patient tolerated treatment well Patient left: in chair;with call bell/phone within reach Nurse Communication: Mobility status PT Visit Diagnosis: Pain;Difficulty in walking, not elsewhere classified (R26.2);Unsteadiness on feet (R26.81) Pain - part of body:  (Back)    Time: 9251-9166 PT Time Calculation (min) (ACUTE ONLY): 45 min   Charges:   PT Evaluation $PT Eval Low Complexity: 1 Low PT Treatments $Gait Training: 23-37 mins PT General Charges $$ ACUTE PT VISIT: 1 Visit         Randall SAUNDERS, PT, DPT Acute Rehabilitation Services Office: [phone] Secure Chat Preferred  Delon CHRISTELLA Callander 12/05/2023, 11:36 AM

## 2023-12-06 ENCOUNTER — Other Ambulatory Visit (HOSPITAL_COMMUNITY): Payer: Self-pay

## 2023-12-06 ENCOUNTER — Telehealth (HOSPITAL_COMMUNITY): Payer: Self-pay | Admitting: Pharmacy Technician

## 2023-12-06 DIAGNOSIS — M5136 Other intervertebral disc degeneration, lumbar region with discogenic back pain only: Secondary | ICD-10-CM | POA: Diagnosis not present

## 2023-12-06 MED ORDER — DOCUSATE SODIUM 100 MG PO CAPS
100.0000 mg | ORAL_CAPSULE | Freq: Two times a day (BID) | ORAL | 0 refills | Status: AC
  Filled 2023-12-06: qty 10, 5d supply, fill #0

## 2023-12-06 MED ORDER — CYCLOBENZAPRINE HCL 5 MG PO TABS
5.0000 mg | ORAL_TABLET | Freq: Three times a day (TID) | ORAL | 1 refills | Status: AC | PRN
  Filled 2023-12-06: qty 30, 10d supply, fill #0

## 2023-12-06 MED ORDER — OXYCODONE-ACETAMINOPHEN 5-325 MG PO TABS
1.0000 | ORAL_TABLET | ORAL | 0 refills | Status: AC | PRN
  Filled 2023-12-06: qty 30, 3d supply, fill #0

## 2023-12-06 MED FILL — Heparin Sodium (Porcine) Inj 1000 Unit/ML: INTRAMUSCULAR | Qty: 30 | Status: AC

## 2023-12-06 MED FILL — Sodium Chloride IV Soln 0.9%: INTRAVENOUS | Qty: 2000 | Status: AC

## 2023-12-06 NOTE — Telephone Encounter (Signed)
 Pharmacy Patient Advocate Encounter  Received notification from [facility] that Prior Authorization for Cyclobenzaprine  HCl 5MG  tablets  has been APPROVED from 12/06/2023 to 04/22/2098   PA #/Case ID/Reference #: [id number]

## 2023-12-06 NOTE — Telephone Encounter (Signed)
 Pharmacy Patient Advocate Encounter   Received notification from Inpatient Request that prior authorization for Cyclobenzaprine  HCl 5MG  tablets  is required/requested.   Insurance verification completed.   The patient is insured through [facility] .   Per test claim: PA required; PA submitted to above mentioned insurance via Latent Key/confirmation #/EOC [id number] Status is pending

## 2023-12-06 NOTE — Plan of Care (Signed)
  Problem: Education: Goal: Knowledge of General Education information will improve Description: Including pain rating scale, medication(s)/side effects and non-pharmacologic comfort measures Outcome: Adequate for Discharge   Problem: Health Behavior/Discharge Planning: Goal: Ability to manage health-related needs will improve Outcome: Adequate for Discharge   Problem: Clinical Measurements: Goal: Ability to maintain clinical measurements within normal limits will improve Outcome: Adequate for Discharge Goal: Will remain free from infection Outcome: Adequate for Discharge Goal: Diagnostic test results will improve Outcome: Adequate for Discharge Goal: Respiratory complications will improve Outcome: Adequate for Discharge Goal: Cardiovascular complication will be avoided Outcome: Adequate for Discharge   Problem: Activity: Goal: Risk for activity intolerance will decrease Outcome: Adequate for Discharge   Problem: Nutrition: Goal: Adequate nutrition will be maintained Outcome: Adequate for Discharge   Problem: Coping: Goal: Level of anxiety will decrease Outcome: Adequate for Discharge   Problem: Elimination: Goal: Will not experience complications related to bowel motility Outcome: Adequate for Discharge Goal: Will not experience complications related to urinary retention Outcome: Adequate for Discharge   Problem: Pain Managment: Goal: General experience of comfort will improve and/or be controlled Outcome: Adequate for Discharge   Problem: Safety: Goal: Ability to remain free from injury will improve Outcome: Adequate for Discharge   Problem: Skin Integrity: Goal: Risk for impaired skin integrity will decrease Outcome: Adequate for Discharge   Problem: Education: Goal: Ability to verbalize activity precautions or restrictions will improve Outcome: Adequate for Discharge Goal: Knowledge of the prescribed therapeutic regimen will improve Outcome: Adequate for  Discharge Goal: Understanding of discharge needs will improve Outcome: Adequate for Discharge   Problem: Activity: Goal: Ability to avoid complications of mobility impairment will improve Outcome: Adequate for Discharge Goal: Ability to tolerate increased activity will improve Outcome: Adequate for Discharge Goal: Will remain free from falls Outcome: Adequate for Discharge   Problem: Bowel/Gastric: Goal: Gastrointestinal status for postoperative course will improve Outcome: Adequate for Discharge   Problem: Clinical Measurements: Goal: Ability to maintain clinical measurements within normal limits will improve Outcome: Adequate for Discharge Goal: Postoperative complications will be avoided or minimized Outcome: Adequate for Discharge Goal: Diagnostic test results will improve Outcome: Adequate for Discharge   Problem: Pain Management: Goal: Pain level will decrease Outcome: Adequate for Discharge   Problem: Skin Integrity: Goal: Will show signs of wound healing Outcome: Adequate for Discharge   Problem: Health Behavior/Discharge Planning: Goal: Identification of resources available to assist in meeting health care needs will improve Outcome: Adequate for Discharge   Problem: Bladder/Genitourinary: Goal: Urinary functional status for postoperative course will improve Outcome: Adequate for Discharge

## 2023-12-06 NOTE — Discharge Instructions (Signed)

## 2023-12-06 NOTE — Discharge Summary (Signed)
 Physician Discharge Summary  Patient ID: Angela Walters MRN: [id number] DOB/AGE: 1951/02/09 73 y.o.  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 The patient tolerated the procedure well and was taken to the recovery room and then to the floor in stable condition. The hospital course was routine. There were no complications. The wound remained clean dry and intact. Pt had appropriate back soreness. No complaints of leg pain or new N/T/W. The patient remained afebrile with stable vital signs, and tolerated a regular diet. The patient continued to increase activities, and pain was well controlled with oral pain medications.   Consults: None  Significant Diagnostic Studies:  Results for orders placed or performed during the hospital encounter of 11/20/23  Surgical pcr screen   Collection Time: 11/20/23 10:04 AM   Specimen: Nasal Mucosa; Nasal Swab  Result Value Ref Range   MRSA, PCR NEGATIVE NEGATIVE   Staphylococcus aureus NEGATIVE NEGATIVE  Basic metabolic panel per protocol   Collection Time: 11/20/23 10:04 AM  Result Value Ref Range   Sodium 139 135 - 145 mmol/L   Potassium 4.2 3.5 - 5.1 mmol/L   Chloride 106 98 - 111 mmol/L   CO2 26 22 - 32 mmol/L   Glucose, Bld 108 (H) 70 - 99 mg/dL   BUN 22 8 - 23 mg/dL   Creatinine, Ser 9.32 0.44 - 1.00 mg/dL   Calcium 9.2 8.9 - 89.6 mg/dL   GFR, Estimated >39 >39 mL/min   Anion gap 7 5 - 15  CBC per protocol   Collection Time: 11/20/23 10:04 AM  Result Value Ref Range   WBC 7.1 4.0 - 10.5 K/uL   RBC 4.69 3.87 - 5.11 MIL/uL    Hemoglobin 14.9 12.0 - 15.0 g/dL   HCT 54.2 63.9 - 53.9 %   MCV 97.4 80.0 - 100.0 fL   MCH 31.8 26.0 - 34.0 pg   MCHC 32.6 30.0 - 36.0 g/dL   RDW 87.0 88.4 - 84.4 %   Platelets 194 150 - 400 K/uL   nRBC 0.0 0.0 - 0.2 %  Type and screen   Collection Time: 11/20/23 10:04 AM  Result Value Ref Range   ABO/RH(D) O POS    Antibody Screen NEG    Sample Expiration 12/04/2023,2359    Extend sample reason      NO TRANSFUSIONS OR PREGNANCY IN THE PAST 3 MONTHS Performed at [facility], [address] [place], [place] [address]     DG Lumbar Spine 2-3 Views Result Date: 12/04/2023 CLINICAL DATA:  Elective surgery. EXAM: LUMBAR SPINE - 2-3 VIEW COMPARISON:  CT 10/11/2023 FINDINGS: Two fluoroscopic spot views of the lumbar spine submitted from the operating room. Previous lower lumbar fusion. There are new pedicle screws at L2 with L2-L3 interbody spacer. Fluoroscopy time 13 seconds. Dose 10.45 mGy. IMPRESSION: Intraoperative fluoroscopy during lumbar fusion. Electronically Signed   By: Andrea Gasman M.D.   On: 12/04/2023 15:17    Antibiotics:  Anti-infectives (From admission, onward)    Start     Dose/Rate Route Frequency Ordered Stop   12/04/23 1930  ceFAZolin  (ANCEF ) IVPB 2g/100 mL premix  2 g 200 mL/hr over 30 Minutes Intravenous Every 8 hours 12/04/23 1644 12/05/23 0836   12/04/23 0845  ceFAZolin  (ANCEF ) IVPB 2g/100 mL premix  Status:  Discontinued        2 g 200 mL/hr over 30 Minutes Intravenous On call to O.R. 12/04/23 0840 12/04/23 1635       Discharge Exam: Blood pressure (!) 171/76, pulse 78, temperature 98.1 F (36.7 C), temperature source Oral, resp. rate 17, height 5' 4 (1.626 m), weight 66.1 kg, SpO2 98%. Neurologic: Grossly normal Ambulating and voiding well incision cdi   Discharge Medications:   Allergies as of 12/06/2023   No Known Allergies      Medication List     TAKE these medications    Calcium Plus Vitamin D3 600-500 MG-UNIT  Caps Generic drug: Calcium Carb-Cholecalciferol Take 1 tablet by mouth daily.   carboxymethylcellulose 0.5 % Soln Commonly known as: REFRESH PLUS Place 1 drop into both eyes 3 (three) times daily as needed (dry eyes).   cyclobenzaprine  5 MG tablet Commonly known as: FLEXERIL  Take 1 tablet (5 mg total) by mouth 3 (three) times daily as needed for muscle spasms.   docusate sodium  100 MG capsule Commonly known as: COLACE Take 1 capsule (100 mg total) by mouth 2 (two) times daily.   Fish Oil Ultra 1400 MG Caps Take 1,400 mg by mouth daily.   Ginkgo Biloba 60 MG Caps Take 60 mg by mouth daily.   magnesium oxide 400 MG tablet Commonly known as: MAG-OX Take 400 mg by mouth daily.   metroNIDAZOLE 0.75 % gel Commonly known as: METROGEL Apply 1 Application topically 2 (two) times daily.   OSTEO BI-FLEX ONE PER DAY PO Take 1 tablet by mouth daily.   oxyCODONE -acetaminophen  5-325 MG tablet Commonly known as: Percocet Take 1-2 tablets by mouth every 4 (four) hours as needed.   Potassium 99 MG Tabs Take 99 mg by mouth daily.   RED YEAST RICE PO Take 1 capsule by mouth daily.   TURMERIC COMPLEX/BLACK PEPPER PO Take 1 tablet by mouth daily.   Vitamin D-3 25 MCG (1000 UT) Caps Take 1,000 Units by mouth daily.   Zinc  50 MG Tabs Take 50 mg by mouth daily.               Discharge Care Instructions  (From admission, onward)           Start     Ordered   12/06/23 0000  If the dressing is still on your incision site when you go home, remove it on the third day after your surgery date. Remove dressing if it begins to fall off, or if it is dirty or damaged before the third day.        12/06/23 0941            Disposition: home   Final Dx: TLIF L2-3  Discharge Instructions      Remove dressing in 72 hours   Complete by: As directed    Call MD for:  difficulty breathing, headache or visual disturbances   Complete by: As directed    Call MD for:  difficulty  breathing, headache or visual disturbances   Complete by: As directed    Call MD for:  hives   Complete by: As directed    Call MD for:  hives   Complete by: As directed    Call MD for:  persistant nausea and vomiting   Complete by: As directed  Call MD for:  persistant nausea and vomiting   Complete by: As directed    Call MD for:  redness, tenderness, or signs of infection (pain, swelling, redness, odor or green/yellow discharge around incision site)   Complete by: As directed    Call MD for:  redness, tenderness, or signs of infection (pain, swelling, redness, odor or green/yellow discharge around incision site)   Complete by: As directed    Call MD for:  severe uncontrolled pain   Complete by: As directed    Call MD for:  severe uncontrolled pain   Complete by: As directed    Call MD for:  temperature >100.4   Complete by: As directed    Diet - low sodium heart healthy   Complete by: As directed    Diet - low sodium heart healthy   Complete by: As directed    If the dressing is still on your incision site when you go home, remove it on the third day after your surgery date. Remove dressing if it begins to fall off, or if it is dirty or damaged before the third day.   Complete by: As directed    Increase activity slowly   Complete by: As directed    Increase activity slowly   Complete by: As directed         Follow-up Information     Sadie Manna, MD Follow up.   Specialty: Internal Medicine Contact information: [address] [place] [address] [phone]         Mavis Purchase, MD. Krista on 12/27/2023.   Specialty: Neurosurgery Why: First post op appointment with x-rays is on 12/27/2023 at 8:30 AM. Contact information: [address] [place] [address] [phone]                  Signed: Suzen Lacks [facility] 12/06/2023, 10:34 AM

## 2024-01-27 ENCOUNTER — Other Ambulatory Visit: Payer: Self-pay | Admitting: Internal Medicine

## 2024-01-27 DIAGNOSIS — Z1231 Encounter for screening mammogram for malignant neoplasm of breast: Secondary | ICD-10-CM

## 2024-02-21 ENCOUNTER — Ambulatory Visit
Admission: RE | Admit: 2024-02-21 | Discharge: 2024-02-21 | Disposition: A | Discharge status: Home / Self Care | Source: Ambulatory Visit | Attending: Internal Medicine | Admitting: Internal Medicine

## 2024-02-21 DIAGNOSIS — Z1231 Encounter for screening mammogram for malignant neoplasm of breast: Secondary | ICD-10-CM | POA: Insufficient documentation
